# Patient Record
Sex: Female | Born: 1945 | Race: White | Hispanic: No | Smoking: Never smoker
Health system: Southern US, Community
[De-identification: ages and names within clinical notes are randomized; demographics above are authoritative.]

## PROBLEM LIST (undated history)

## (undated) DIAGNOSIS — E785 Hyperlipidemia, unspecified: Secondary | ICD-10-CM

## (undated) DIAGNOSIS — M1990 Unspecified osteoarthritis, unspecified site: Secondary | ICD-10-CM

## (undated) DIAGNOSIS — T753XXA Motion sickness, initial encounter: Secondary | ICD-10-CM

## (undated) DIAGNOSIS — I10 Essential (primary) hypertension: Secondary | ICD-10-CM

## (undated) DIAGNOSIS — E559 Vitamin D deficiency, unspecified: Secondary | ICD-10-CM

## (undated) DIAGNOSIS — C44300 Unspecified malignant neoplasm of skin of unspecified part of face: Secondary | ICD-10-CM

## (undated) DIAGNOSIS — E7800 Pure hypercholesterolemia, unspecified: Secondary | ICD-10-CM

## (undated) DIAGNOSIS — R51 Headache: Secondary | ICD-10-CM

## (undated) DIAGNOSIS — J302 Other seasonal allergic rhinitis: Secondary | ICD-10-CM

## (undated) DIAGNOSIS — E119 Type 2 diabetes mellitus without complications: Secondary | ICD-10-CM

## (undated) DIAGNOSIS — R519 Headache, unspecified: Secondary | ICD-10-CM

## (undated) DIAGNOSIS — M109 Gout, unspecified: Secondary | ICD-10-CM

## (undated) DIAGNOSIS — C44311 Basal cell carcinoma of skin of nose: Secondary | ICD-10-CM

## (undated) DIAGNOSIS — E039 Hypothyroidism, unspecified: Secondary | ICD-10-CM

## (undated) DIAGNOSIS — B019 Varicella without complication: Secondary | ICD-10-CM

## (undated) DIAGNOSIS — M810 Age-related osteoporosis without current pathological fracture: Secondary | ICD-10-CM

## (undated) DIAGNOSIS — K649 Unspecified hemorrhoids: Secondary | ICD-10-CM

## (undated) HISTORY — PX: MOHS SURGERY: SUR867

## (undated) HISTORY — PX: ANKLE GANGLION CYST EXCISION: SHX1148

## (undated) HISTORY — PX: THYROID SURGERY: SHX805

## (undated) HISTORY — PX: OOPHORECTOMY: SHX86

## (undated) HISTORY — DX: Unspecified malignant neoplasm of skin of unspecified part of face: C44.300

## (undated) HISTORY — PX: CHOLECYSTECTOMY: SHX55

## (undated) HISTORY — PX: ABDOMINAL HYSTERECTOMY: SHX81

---

## 2010-04-28 ENCOUNTER — Ambulatory Visit: Payer: Self-pay | Admitting: Unknown Physician Specialty

## 2011-05-03 ENCOUNTER — Ambulatory Visit: Payer: Self-pay | Admitting: Family Medicine

## 2012-11-03 ENCOUNTER — Ambulatory Visit: Payer: Self-pay | Admitting: Unknown Physician Specialty

## 2014-10-21 ENCOUNTER — Ambulatory Visit: Payer: Self-pay | Admitting: Ophthalmology

## 2014-10-21 LAB — POTASSIUM: Potassium: 4 mmol/L (ref 3.5–5.1)

## 2014-11-01 HISTORY — PX: EYE SURGERY: SHX253

## 2014-11-06 ENCOUNTER — Ambulatory Visit: Payer: Self-pay | Admitting: Ophthalmology

## 2015-03-02 NOTE — Op Note (Signed)
PATIENT NAME:  Chelsea Hurley, Chelsea Hurley [facility] C MR#:  [id number] DATE OF BIRTH:  September 11, 1946  DATE OF PROCEDURE:  10/07/2015  PREPROCEDURE DIAGNOSIS: Epiretinal membrane, left eye.   POSTPROCEDURE DIAGNOSIS: Epiretinal membrane, left eye.   PROCEDURE PERFORMED: A 25-gauge pars plana vitrectomy with ICG, ERM, and ILM peeling in the left eye, and endolaser.  COMPLICATIONS: None.   BLOOD LOSS: Minimal.   ANESTHESIA: General.   PROCEDURE NOTE: The patient was examined in the clinic for a visually significant epiretinal membrane in the left eye. Risks, benefits, alternatives, and complications were discussed with patient. The patient elected to proceed with pars plana vitrectomy with membrane peeling. On the day of the procedure, the patient was greeted in the preoperative holding area. The left eye was marked, consent reviewed, any questions were answered. The patient was brought into the operating room in supine position and placed under general anesthesia. The left eye was prepped and draped in the usual sterile fashion. A 25-gauge trocar system was used and the infusion cannula was checked prior to starting the infusion to make sure it was in the vitreous cavity. A core vitrectomy was performed. The patient did have a partial PVD and so the posterior vitreous detachment was extended and then the periphery was shaved for 360 degrees. ICG was used to stain the retinal surface, and a flat macular contact lens was used in conjunction with ILM forceps to peel the epiretinal membrane and ILM for a radius of greater than 1 disk diameter around the fovea. Then, the wide-angle biome was then again used to examine the retina. There was some question of if part of the posterior hyaloid was still down and so Kenalog was used to stain the vitreous. There was no posterior hyaloid remnant; however, a scleral depressor was used to inspect the periphery for 360 degrees. There were no retinal breaks or tears found; however, there was one  area inferiorly of some lattice, which was lasered. The trocars were removed, and sutured if they were not watertight. Subconjunctival dexamethasone was placed. When the speculum was removed, the patient's eye was patched and shielded with Neo-Poly-Dex ointment, and the patient was brought out of general anesthesia and taken to the recovery area in stable condition.     ____________________________ Laban Emperor Oval Linsey, MD jdr:mw D: 11/06/2014 10:39:44 ET T: 11/06/2014 14:21:30 ET JOB#: [id number]  cc: Janett Billow D. Oval Linsey, MD, <Dictator> Mc Bloodworth D [facility] MD ELECTRONICALLY SIGNED 11/13/2014 10:12

## 2015-08-13 ENCOUNTER — Encounter: Payer: Self-pay | Admitting: *Deleted

## 2015-08-18 NOTE — Discharge Instructions (Signed)

## 2015-08-20 ENCOUNTER — Encounter
Admission: RE | Disposition: A | Discharge status: Home / Self Care | Payer: Self-pay | Source: Ambulatory Visit | Attending: Ophthalmology

## 2015-08-20 ENCOUNTER — Ambulatory Visit: Payer: Medicare Other | Admitting: Anesthesiology

## 2015-08-20 ENCOUNTER — Ambulatory Visit
Admission: RE | Admit: 2015-08-20 | Discharge: 2015-08-20 | Disposition: A | Discharge status: Home / Self Care | Payer: Medicare Other | Source: Ambulatory Visit | Attending: Ophthalmology | Admitting: Ophthalmology

## 2015-08-20 DIAGNOSIS — M810 Age-related osteoporosis without current pathological fracture: Secondary | ICD-10-CM | POA: Insufficient documentation

## 2015-08-20 DIAGNOSIS — H2512 Age-related nuclear cataract, left eye: Secondary | ICD-10-CM | POA: Diagnosis not present

## 2015-08-20 DIAGNOSIS — Z881 Allergy status to other antibiotic agents status: Secondary | ICD-10-CM | POA: Insufficient documentation

## 2015-08-20 DIAGNOSIS — Z90710 Acquired absence of both cervix and uterus: Secondary | ICD-10-CM | POA: Diagnosis not present

## 2015-08-20 DIAGNOSIS — Z9049 Acquired absence of other specified parts of digestive tract: Secondary | ICD-10-CM | POA: Diagnosis not present

## 2015-08-20 DIAGNOSIS — M1990 Unspecified osteoarthritis, unspecified site: Secondary | ICD-10-CM | POA: Diagnosis not present

## 2015-08-20 DIAGNOSIS — Z885 Allergy status to narcotic agent status: Secondary | ICD-10-CM | POA: Diagnosis not present

## 2015-08-20 DIAGNOSIS — Z888 Allergy status to other drugs, medicaments and biological substances status: Secondary | ICD-10-CM | POA: Diagnosis not present

## 2015-08-20 DIAGNOSIS — E7800 Pure hypercholesterolemia, unspecified: Secondary | ICD-10-CM | POA: Insufficient documentation

## 2015-08-20 DIAGNOSIS — I10 Essential (primary) hypertension: Secondary | ICD-10-CM | POA: Diagnosis not present

## 2015-08-20 DIAGNOSIS — E119 Type 2 diabetes mellitus without complications: Secondary | ICD-10-CM | POA: Insufficient documentation

## 2015-08-20 DIAGNOSIS — G43909 Migraine, unspecified, not intractable, without status migrainosus: Secondary | ICD-10-CM | POA: Insufficient documentation

## 2015-08-20 DIAGNOSIS — Z85828 Personal history of other malignant neoplasm of skin: Secondary | ICD-10-CM | POA: Diagnosis not present

## 2015-08-20 HISTORY — DX: Hypothyroidism, unspecified: E03.9

## 2015-08-20 HISTORY — DX: Headache, unspecified: R51.9

## 2015-08-20 HISTORY — DX: Type 2 diabetes mellitus without complications: E11.9

## 2015-08-20 HISTORY — DX: Age-related osteoporosis without current pathological fracture: M81.0

## 2015-08-20 HISTORY — DX: Other seasonal allergic rhinitis: J30.2

## 2015-08-20 HISTORY — DX: Essential (primary) hypertension: I10

## 2015-08-20 HISTORY — DX: Basal cell carcinoma of skin of nose: C44.311

## 2015-08-20 HISTORY — DX: Unspecified osteoarthritis, unspecified site: M19.90

## 2015-08-20 HISTORY — DX: Pure hypercholesterolemia, unspecified: E78.00

## 2015-08-20 HISTORY — DX: Motion sickness, initial encounter: T75.3XXA

## 2015-08-20 HISTORY — PX: CATARACT EXTRACTION W/PHACO: SHX586

## 2015-08-20 HISTORY — DX: Headache: R51

## 2015-08-20 LAB — GLUCOSE, CAPILLARY
GLUCOSE-CAPILLARY: 104 mg/dL — AB (ref 65–99)
Glucose-Capillary: 118 mg/dL — ABNORMAL HIGH (ref 65–99)

## 2015-08-20 SURGERY — PHACOEMULSIFICATION, CATARACT, WITH IOL INSERTION
Anesthesia: Monitor Anesthesia Care | Laterality: Left | Wound class: Clean

## 2015-08-20 MED ORDER — TIMOLOL MALEATE 0.5 % OP SOLN
OPHTHALMIC | Status: DC | PRN
  Administered 2015-08-20: 1 [drp] via OPHTHALMIC

## 2015-08-20 MED ORDER — FENTANYL CITRATE (PF) 100 MCG/2ML IJ SOLN
INTRAMUSCULAR | Status: DC | PRN
  Administered 2015-08-20: 50 ug via INTRAVENOUS

## 2015-08-20 MED ORDER — LACTATED RINGERS IV SOLN: INTRAVENOUS | Status: DC

## 2015-08-20 MED ORDER — NA HYALUR & NA CHOND-NA HYALUR 0.4-0.35 ML IO KIT
PACK | INTRAOCULAR | Status: DC | PRN
  Administered 2015-08-20: 1 mL via INTRAOCULAR

## 2015-08-20 MED ORDER — ARMC OPHTHALMIC DILATING GEL
1.0000 "application " | OPHTHALMIC | Status: DC | PRN
  Administered 2015-08-20 (×2): 1 via OPHTHALMIC

## 2015-08-20 MED ORDER — EPINEPHRINE HCL 1 MG/ML IJ SOLN
INTRAOCULAR | Status: DC | PRN
  Administered 2015-08-20: 85 mL via OPHTHALMIC

## 2015-08-20 MED ORDER — OXYCODONE HCL 5 MG/5ML PO SOLN: 5.0000 mg | Freq: Once | ORAL | Status: DC | PRN

## 2015-08-20 MED ORDER — OXYCODONE HCL 5 MG PO TABS: 5.0000 mg | ORAL_TABLET | Freq: Once | ORAL | Status: DC | PRN

## 2015-08-20 MED ORDER — LIDOCAINE HCL (PF) 4 % IJ SOLN
INTRAOCULAR | Status: DC | PRN
  Administered 2015-08-20: 1 mL via OPHTHALMIC

## 2015-08-20 MED ORDER — MIDAZOLAM HCL 2 MG/2ML IJ SOLN
INTRAMUSCULAR | Status: DC | PRN
  Administered 2015-08-20: 1 mg via INTRAVENOUS

## 2015-08-20 MED ORDER — TETRACAINE HCL 0.5 % OP SOLN
1.0000 [drp] | OPHTHALMIC | Status: DC | PRN
  Administered 2015-08-20: 1 [drp] via OPHTHALMIC

## 2015-08-20 MED ORDER — CEFUROXIME OPHTHALMIC INJECTION 1 MG/0.1 ML
INJECTION | OPHTHALMIC | Status: DC | PRN
  Administered 2015-08-20: 0.1 mL via INTRACAMERAL

## 2015-08-20 MED ORDER — POVIDONE-IODINE 5 % OP SOLN
1.0000 "application " | OPHTHALMIC | Status: DC | PRN
  Administered 2015-08-20: 1 via OPHTHALMIC

## 2015-08-20 MED ORDER — BRIMONIDINE TARTRATE 0.2 % OP SOLN
OPHTHALMIC | Status: DC | PRN
  Administered 2015-08-20: 1 [drp] via OPHTHALMIC

## 2015-08-20 SURGICAL SUPPLY — 25 items
CANNULA ANT/CHMB 27GA (MISCELLANEOUS) ×3 IMPLANT
GLOVE SURG LX 7.5 STRW (GLOVE) ×2
GLOVE SURG LX STRL 7.5 STRW (GLOVE) ×1 IMPLANT
GLOVE SURG TRIUMPH 8.0 PF LTX (GLOVE) ×3 IMPLANT
GOWN STRL REUS W/ TWL LRG LVL3 (GOWN DISPOSABLE) ×2 IMPLANT
GOWN STRL REUS W/TWL LRG LVL3 (GOWN DISPOSABLE) ×4
LENS IOL TECNIS 14.0 (Intraocular Lens) ×3 IMPLANT
MARKER SKIN SURG W/RULER VIO (MISCELLANEOUS) ×3 IMPLANT
NDL RETROBULBAR .5 NSTRL (NEEDLE) IMPLANT
NEEDLE FILTER BLUNT 18X 1/2SAF (NEEDLE) ×2
NEEDLE FILTER BLUNT 18X1 1/2 (NEEDLE) ×1 IMPLANT
PACK CATARACT BRASINGTON (MISCELLANEOUS) ×3 IMPLANT
PACK EYE AFTER SURG (MISCELLANEOUS) ×3 IMPLANT
PACK OPTHALMIC (MISCELLANEOUS) ×3 IMPLANT
RING MALYGIN 7.0 (MISCELLANEOUS) IMPLANT
SUT ETHILON 10-0 CS-B-6CS-B-6 (SUTURE)
SUT VICRYL  9 0 (SUTURE)
SUT VICRYL 9 0 (SUTURE) IMPLANT
SUTURE EHLN 10-0 CS-B-6CS-B-6 (SUTURE) IMPLANT
SYR 3ML LL SCALE MARK (SYRINGE) ×3 IMPLANT
SYR 5ML LL (SYRINGE) IMPLANT
SYR TB 1ML LUER SLIP (SYRINGE) ×3 IMPLANT
WATER STERILE IRR 250ML POUR (IV SOLUTION) ×3 IMPLANT
WATER STERILE IRR 500ML POUR (IV SOLUTION) IMPLANT
WIPE NON LINTING 3.25X3.25 (MISCELLANEOUS) ×3 IMPLANT

## 2015-08-20 NOTE — Anesthesia Preprocedure Evaluation (Signed)
Anesthesia Evaluation  Patient identified by MRN, date of birth, ID band Patient awake    Reviewed: Allergy & Precautions, H&P , Patient's Chart, lab work & pertinent test results  History of Anesthesia Complications Negative for: history of anesthetic complications  Airway Mallampati: II  TM Distance: >3 FB     Dental   Pulmonary neg pulmonary ROS,    Pulmonary exam normal        Cardiovascular hypertension, On Medications Normal cardiovascular exam     Neuro/Psych    GI/Hepatic negative GI ROS, Neg liver ROS,   Endo/Other  diabetes, Well Controlled, Type 2, Oral Hypoglycemic Agents  Renal/GU negative Renal ROS     Musculoskeletal   Abdominal   Peds  Hematology negative hematology ROS (+)   Anesthesia Other Findings   Reproductive/Obstetrics                             Anesthesia Physical Anesthesia Plan  ASA: II  Anesthesia Plan: MAC   Post-op Pain Management:    Induction:   Airway Management Planned:   Additional Equipment:   Intra-op Plan:   Post-operative Plan:   Informed Consent: I have reviewed the patients History and Physical, chart, labs and discussed the procedure including the risks, benefits and alternatives for the proposed anesthesia with the patient or authorized representative who has indicated his/her understanding and acceptance.     Plan Discussed with:   Anesthesia Plan Comments:         Anesthesia Quick Evaluation

## 2015-08-20 NOTE — Anesthesia Postprocedure Evaluation (Signed)
  Anesthesia Post-op Note  Patient: Chelsea Hurley  Procedure(s) Performed: Procedure(s) with comments: CATARACT EXTRACTION PHACO AND INTRAOCULAR LENS PLACEMENT (IOC) (Left) - DIABETIC - oral meds  Anesthesia type:MAC  Patient location: PACU  Post pain: Pain level controlled  Post assessment: Post-op Vital signs reviewed, Patient's Cardiovascular Status Stable, Respiratory Function Stable, Patent Airway and No signs of Nausea or vomiting  Post vital signs: Reviewed and stable  Last Vitals:  Filed Vitals:   08/20/15 0815  BP: 105/57  Pulse: 67  Temp:   Resp: 12    Level of consciousness: awake, alert  and patient cooperative  Complications: No apparent anesthesia complications

## 2015-08-20 NOTE — Transfer of Care (Signed)
Immediate Anesthesia Transfer of Care Note  Patient: Chelsea Hurley  Procedure(s) Performed: Procedure(s) with comments: CATARACT EXTRACTION PHACO AND INTRAOCULAR LENS PLACEMENT (IOC) (Left) - DIABETIC - oral meds  Patient Location: PACU  Anesthesia Type: MAC  Level of Consciousness: awake, alert  and patient cooperative  Airway and Oxygen Therapy: Patient Spontanous Breathing and Patient connected to supplemental oxygen  Post-op Assessment: Post-op Vital signs reviewed, Patient's Cardiovascular Status Stable, Respiratory Function Stable, Patent Airway and No signs of Nausea or vomiting  Post-op Vital Signs: Reviewed and stable  Complications: No apparent anesthesia complications

## 2015-08-20 NOTE — Op Note (Signed)
OPERATIVE NOTE  Chelsea Hurley [id number] 08/20/2015   PREOPERATIVE DIAGNOSIS:  Nuclear sclerotic cataract left eye. H25.12   POSTOPERATIVE DIAGNOSIS:    Nuclear sclerotic cataract left eye.     PROCEDURE:  Phacoemusification with posterior chamber intraocular lens placement of the left eye   LENS:   Implant Name Type Inv. Item Serial No. Manufacturer Lot No. LRB No. Used  LENS IOL TECNIS 14.0 - [id number] Intraocular Lens LENS IOL TECNIS 14.0 [id number] AMO   Left 1        ULTRASOUND TIME: 20  % of 1 minutes 51 seconds, CDE 22.4  SURGEON:  Wyonia Hough, MD   ANESTHESIA:  Topical with tetracaine drops and 2% Xylocaine jelly, augmented with 1% preservative-free intracameral lidocaine. .   COMPLICATIONS:  None.   DESCRIPTION OF PROCEDURE:  The patient was identified in the holding room and transported to the operating room and placed in the supine position under the operating microscope.  The left eye was identified as the operative eye and it was prepped and draped in the usual sterile ophthalmic fashion.   A 1 millimeter clear-corneal paracentesis was made at the 1:30 position.  0.5 ml of preservative-free 1% lidocaine was injected into the anterior chamber. The anterior chamber was filled with Viscoat viscoelastic.  A 2.4 millimeter keratome was used to make a near-clear corneal incision at the 10:30 position.  .  A curvilinear capsulorrhexis was made with a cystotome and capsulorrhexis forceps.  Balanced salt solution was used to hydrodissect and hydrodelineate the nucleus.   Phacoemulsification was then used in stop and chop fashion to remove the lens nucleus and epinucleus.  The remaining cortex was then removed using the irrigation and aspiration handpiece. Provisc was then placed into the capsular bag to distend it for lens placement.  A lens was then injected into the capsular bag.  The remaining viscoelastic was aspirated.   Wounds were hydrated with balanced salt  solution.  The anterior chamber was inflated to a physiologic pressure with balanced salt solution.  No wound leaks were noted. Cefuroxime 0.1 ml of a 10mg /ml solution was injected into the anterior chamber for a dose of 1 mg of intracameral antibiotic at the completion of the case.   Timolol and Brimonidine drops were applied to the eye.  The patient was taken to the recovery room in stable condition without complications of anesthesia or surgery.  [name] 08/20/2015, 8:06 AM

## 2015-08-20 NOTE — H&P (Signed)
\  The History and Physical notes are on paper, have been signed, and are to be scanned. The patient remains stable and unchanged from the H&P.   Previous H&P reviewed, patient examined, and there are no changes.  Chelsea Hurley 08/20/2015 7:34 AM

## 2015-08-20 NOTE — Anesthesia Procedure Notes (Signed)
Procedure Name: LMA Insertion Performed by: Nat Christen Pre-anesthesia Checklist: Patient identified, Emergency Drugs available, Suction available, Patient being monitored and Timeout performed Patient Re-evaluated:Patient Re-evaluated prior to inductionOxygen Delivery Method: Nasal cannula

## 2015-08-21 ENCOUNTER — Encounter: Payer: Self-pay | Admitting: Ophthalmology

## 2015-11-19 ENCOUNTER — Encounter: Payer: Self-pay | Admitting: *Deleted

## 2015-11-25 NOTE — Discharge Instructions (Signed)

## 2015-11-26 ENCOUNTER — Ambulatory Visit
Admission: RE | Admit: 2015-11-26 | Discharge: 2015-11-26 | Disposition: A | Discharge status: Home / Self Care | Payer: Medicare Other | Source: Ambulatory Visit | Attending: Ophthalmology | Admitting: Ophthalmology

## 2015-11-26 ENCOUNTER — Ambulatory Visit: Payer: Medicare Other | Admitting: Anesthesiology

## 2015-11-26 ENCOUNTER — Encounter
Admission: RE | Disposition: A | Discharge status: Home / Self Care | Payer: Self-pay | Source: Ambulatory Visit | Attending: Ophthalmology

## 2015-11-26 DIAGNOSIS — Z7984 Long term (current) use of oral hypoglycemic drugs: Secondary | ICD-10-CM | POA: Insufficient documentation

## 2015-11-26 DIAGNOSIS — H2511 Age-related nuclear cataract, right eye: Secondary | ICD-10-CM | POA: Diagnosis present

## 2015-11-26 DIAGNOSIS — I10 Essential (primary) hypertension: Secondary | ICD-10-CM | POA: Insufficient documentation

## 2015-11-26 DIAGNOSIS — E119 Type 2 diabetes mellitus without complications: Secondary | ICD-10-CM | POA: Insufficient documentation

## 2015-11-26 HISTORY — PX: CATARACT EXTRACTION W/PHACO: SHX586

## 2015-11-26 LAB — GLUCOSE, CAPILLARY
GLUCOSE-CAPILLARY: 123 mg/dL — AB (ref 65–99)
Glucose-Capillary: 135 mg/dL — ABNORMAL HIGH (ref 65–99)

## 2015-11-26 SURGERY — PHACOEMULSIFICATION, CATARACT, WITH IOL INSERTION
Anesthesia: Monitor Anesthesia Care | Laterality: Right

## 2015-11-26 MED ORDER — DEXAMETHASONE SODIUM PHOSPHATE 4 MG/ML IJ SOLN: 8.0000 mg | Freq: Once | INTRAMUSCULAR | Status: DC | PRN

## 2015-11-26 MED ORDER — NA HYALUR & NA CHOND-NA HYALUR 0.4-0.35 ML IO KIT
PACK | INTRAOCULAR | Status: DC | PRN
  Administered 2015-11-26: 1 mL via INTRAOCULAR

## 2015-11-26 MED ORDER — FENTANYL CITRATE (PF) 100 MCG/2ML IJ SOLN: 25.0000 ug | INTRAMUSCULAR | Status: DC | PRN

## 2015-11-26 MED ORDER — ACETAMINOPHEN 325 MG PO TABS: 325.0000 mg | ORAL_TABLET | ORAL | Status: DC | PRN

## 2015-11-26 MED ORDER — ARMC OPHTHALMIC DILATING GEL
1.0000 "application " | OPHTHALMIC | Status: DC | PRN
  Administered 2015-11-26 (×2): 1 via OPHTHALMIC

## 2015-11-26 MED ORDER — BSS IO SOLN
INTRAOCULAR | Status: DC | PRN
  Administered 2015-11-26: 75 mL via OPHTHALMIC

## 2015-11-26 MED ORDER — LACTATED RINGERS IV SOLN: 500.0000 mL | INTRAVENOUS | Status: DC

## 2015-11-26 MED ORDER — POVIDONE-IODINE 5 % OP SOLN
1.0000 "application " | OPHTHALMIC | Status: DC | PRN
  Administered 2015-11-26: 1 via OPHTHALMIC

## 2015-11-26 MED ORDER — ACETAMINOPHEN 160 MG/5ML PO SOLN: 325.0000 mg | ORAL | Status: DC | PRN

## 2015-11-26 MED ORDER — TETRACAINE HCL 0.5 % OP SOLN
1.0000 [drp] | OPHTHALMIC | Status: DC | PRN
  Administered 2015-11-26: 1 [drp] via OPHTHALMIC

## 2015-11-26 MED ORDER — CEFUROXIME OPHTHALMIC INJECTION 1 MG/0.1 ML
INJECTION | OPHTHALMIC | Status: DC | PRN
  Administered 2015-11-26: 0.1 mL via INTRACAMERAL

## 2015-11-26 MED ORDER — BALANCED SALT IO SOLN
INTRAOCULAR | Status: DC | PRN
  Administered 2015-11-26: 1 mL via OPHTHALMIC

## 2015-11-26 MED ORDER — FENTANYL CITRATE (PF) 100 MCG/2ML IJ SOLN
INTRAMUSCULAR | Status: DC | PRN
  Administered 2015-11-26: 50 ug via INTRAVENOUS

## 2015-11-26 MED ORDER — BRIMONIDINE TARTRATE 0.2 % OP SOLN
OPHTHALMIC | Status: DC | PRN
  Administered 2015-11-26: 1 [drp] via OPHTHALMIC

## 2015-11-26 MED ORDER — TIMOLOL MALEATE 0.5 % OP SOLN
OPHTHALMIC | Status: DC | PRN
  Administered 2015-11-26: 1 [drp] via OPHTHALMIC

## 2015-11-26 MED ORDER — MIDAZOLAM HCL 2 MG/2ML IJ SOLN
INTRAMUSCULAR | Status: DC | PRN
  Administered 2015-11-26: 2 mg via INTRAVENOUS

## 2015-11-26 MED ORDER — LACTATED RINGERS IV SOLN: INTRAVENOUS | Status: DC

## 2015-11-26 SURGICAL SUPPLY — 27 items
CANNULA ANT/CHMB 27GA (MISCELLANEOUS) ×3 IMPLANT
CARTRIDGE ABBOTT (MISCELLANEOUS) ×3 IMPLANT
GLOVE SURG LX 7.5 STRW (GLOVE) ×2
GLOVE SURG LX STRL 7.5 STRW (GLOVE) ×1 IMPLANT
GLOVE SURG TRIUMPH 8.0 PF LTX (GLOVE) ×3 IMPLANT
GOWN STRL REUS W/ TWL LRG LVL3 (GOWN DISPOSABLE) ×2 IMPLANT
GOWN STRL REUS W/TWL LRG LVL3 (GOWN DISPOSABLE) ×4
LENS IOL TECNIS 15.0 (Intraocular Lens) ×3 IMPLANT
LENS IOL TECNIS MONO 1P 15.0 (Intraocular Lens) ×1 IMPLANT
MARKER SKIN SURG W/RULER VIO (MISCELLANEOUS) ×3 IMPLANT
NDL RETROBULBAR .5 NSTRL (NEEDLE) IMPLANT
NEEDLE FILTER BLUNT 18X 1/2SAF (NEEDLE) ×2
NEEDLE FILTER BLUNT 18X1 1/2 (NEEDLE) ×1 IMPLANT
PACK CATARACT BRASINGTON (MISCELLANEOUS) ×3 IMPLANT
PACK EYE AFTER SURG (MISCELLANEOUS) ×3 IMPLANT
PACK OPTHALMIC (MISCELLANEOUS) ×3 IMPLANT
RING MALYGIN 7.0 (MISCELLANEOUS) IMPLANT
SUT ETHILON 10-0 CS-B-6CS-B-6 (SUTURE)
SUT VICRYL  9 0 (SUTURE)
SUT VICRYL 9 0 (SUTURE) IMPLANT
SUTURE EHLN 10-0 CS-B-6CS-B-6 (SUTURE) IMPLANT
SYR 3ML LL SCALE MARK (SYRINGE) ×3 IMPLANT
SYR 5ML LL (SYRINGE) IMPLANT
SYR TB 1ML LUER SLIP (SYRINGE) ×3 IMPLANT
WATER STERILE IRR 250ML POUR (IV SOLUTION) ×3 IMPLANT
WATER STERILE IRR 500ML POUR (IV SOLUTION) IMPLANT
WIPE NON LINTING 3.25X3.25 (MISCELLANEOUS) ×3 IMPLANT

## 2015-11-26 NOTE — Anesthesia Preprocedure Evaluation (Signed)
Anesthesia Evaluation  Patient identified by MRN, date of birth, ID band Patient awake    Reviewed: Allergy & Precautions, H&P , Patient's Chart, lab work & pertinent test results  History of Anesthesia Complications Negative for: history of anesthetic complications  Airway Mallampati: II  TM Distance: >3 FB     Dental   Pulmonary neg pulmonary ROS,    Pulmonary exam normal        Cardiovascular hypertension, On Medications Normal cardiovascular exam     Neuro/Psych    GI/Hepatic negative GI ROS, Neg liver ROS,   Endo/Other  diabetes, Well Controlled, Type 2, Oral Hypoglycemic Agents  Renal/GU negative Renal ROS     Musculoskeletal   Abdominal   Peds  Hematology negative hematology ROS (+)   Anesthesia Other Findings   Reproductive/Obstetrics                             Anesthesia Physical Anesthesia Plan  ASA: II  Anesthesia Plan: MAC   Post-op Pain Management:    Induction:   Airway Management Planned:   Additional Equipment:   Intra-op Plan:   Post-operative Plan:   Informed Consent: I have reviewed the patients History and Physical, chart, labs and discussed the procedure including the risks, benefits and alternatives for the proposed anesthesia with the patient or authorized representative who has indicated his/her understanding and acceptance.     Plan Discussed with:   Anesthesia Plan Comments:         Anesthesia Quick Evaluation  

## 2015-11-26 NOTE — Anesthesia Procedure Notes (Signed)
Procedure Name: MAC Performed by: Digby Groeneveld Pre-anesthesia Checklist: Patient identified, Emergency Drugs available, Suction available, Timeout performed and Patient being monitored Patient Re-evaluated:Patient Re-evaluated prior to inductionOxygen Delivery Method: Nasal cannula Placement Confirmation: positive ETCO2       

## 2015-11-26 NOTE — Transfer of Care (Signed)
Immediate Anesthesia Transfer of Care Note  Patient: Chelsea Hurley  Procedure(s) Performed: Procedure(s) with comments: CATARACT EXTRACTION PHACO AND INTRAOCULAR LENS PLACEMENT (IOC) (Right) - DIABETIC  Patient Location: PACU  Anesthesia Type: MAC  Level of Consciousness: awake, alert  and patient cooperative  Airway and Oxygen Therapy: Patient Spontanous Breathing and Patient connected to supplemental oxygen  Post-op Assessment: Post-op Vital signs reviewed, Patient's Cardiovascular Status Stable, Respiratory Function Stable, Patent Airway and No signs of Nausea or vomiting  Post-op Vital Signs: Reviewed and stable  Complications: No apparent anesthesia complications

## 2015-11-26 NOTE — Op Note (Signed)
LOCATION:  [place]   PREOPERATIVE DIAGNOSIS:    Nuclear sclerotic cataract right eye. H25.11   POSTOPERATIVE DIAGNOSIS:  Nuclear sclerotic cataract right eye.     PROCEDURE:  Phacoemusification with posterior chamber intraocular lens placement of the right eye   LENS:   Implant Name Type Inv. Item Serial No. Manufacturer Lot No. LRB No. Used  LENS IMPL INTRAOC ZCB00 15.0 - [id number] Intraocular Lens LENS IMPL INTRAOC ZCB00 15.0 [id number]   Right 1        ULTRASOUND TIME: 11 % of 0 minutes, 56 seconds.  CDE 6.1   SURGEON:  Wyonia Hough, MD   ANESTHESIA:  Topical with tetracaine drops and 2% Xylocaine jelly.   COMPLICATIONS:  None.   DESCRIPTION OF PROCEDURE:  The patient was identified in the holding room and transported to the operating room and placed in the supine position under the operating microscope.  The right eye was identified as the operative eye and it was prepped and draped in the usual sterile ophthalmic fashion.   A 1 millimeter clear-corneal paracentesis was made at the 12:00 position.  The anterior chamber was filled with Viscoat viscoelastic.  A 2.4 millimeter keratome was used to make a near-clear corneal incision at the 9:00 position.  A curvilinear capsulorrhexis was made with a cystotome and capsulorrhexis forceps.  Balanced salt solution was used to hydrodissect and hydrodelineate the nucleus.   Phacoemulsification was then used in stop and chop fashion to remove the lens nucleus and epinucleus.  The remaining cortex was then removed using the irrigation and aspiration handpiece. Provisc was then placed into the capsular bag to distend it for lens placement.  A lens was then injected into the capsular bag.  The remaining viscoelastic was aspirated.   Wounds were hydrated with balanced salt solution.  The anterior chamber was inflated to a physiologic pressure with balanced salt solution.  No wound leaks were noted. Cefuroxime 0.1 ml of a  10mg /ml solution was injected into the anterior chamber for a dose of 1 mg of intracameral antibiotic at the completion of the case.   Timolol and Brimonidine drops were applied to the eye.  The patient was taken to the recovery room in stable condition without complications of anesthesia or surgery.   Samyukta Cura 11/26/2015, 8:02 AM

## 2015-11-26 NOTE — H&P (Signed)
  The History and Physical notes are on paper, have been signed, and are to be scanned. The patient remains stable and unchanged from the H&P.   Previous H&P reviewed, patient examined, and there are no changes.  Chelsea Hurley 11/26/2015 7:34 AM

## 2015-11-26 NOTE — Anesthesia Postprocedure Evaluation (Signed)
Anesthesia Post Note  Patient: Chelsea Hurley  Procedure(s) Performed: Procedure(s) (LRB): CATARACT EXTRACTION PHACO AND INTRAOCULAR LENS PLACEMENT (IOC) (Right)  Patient location during evaluation: PACU Anesthesia Type: MAC Level of consciousness: awake and alert Pain management: pain level controlled Vital Signs Assessment: post-procedure vital signs reviewed and stable Respiratory status: spontaneous breathing, nonlabored ventilation and respiratory function stable Cardiovascular status: blood pressure returned to baseline and stable Postop Assessment: no signs of nausea or vomiting Anesthetic complications: no    DANIEL D KOVACS

## 2015-11-27 ENCOUNTER — Encounter: Payer: Self-pay | Admitting: Ophthalmology

## 2016-02-05 ENCOUNTER — Ambulatory Visit: Payer: Medicare Other | Admitting: Anesthesiology

## 2016-02-05 ENCOUNTER — Encounter
Admission: RE | Disposition: A | Discharge status: Home / Self Care | Payer: Self-pay | Source: Ambulatory Visit | Attending: Unknown Physician Specialty

## 2016-02-05 ENCOUNTER — Encounter: Payer: Self-pay | Admitting: Anesthesiology

## 2016-02-05 ENCOUNTER — Ambulatory Visit
Admission: RE | Admit: 2016-02-05 | Discharge: 2016-02-05 | Disposition: A | Discharge status: Home / Self Care | Payer: Medicare Other | Source: Ambulatory Visit | Attending: Unknown Physician Specialty | Admitting: Unknown Physician Specialty

## 2016-02-05 DIAGNOSIS — Z90710 Acquired absence of both cervix and uterus: Secondary | ICD-10-CM | POA: Insufficient documentation

## 2016-02-05 DIAGNOSIS — E039 Hypothyroidism, unspecified: Secondary | ICD-10-CM | POA: Insufficient documentation

## 2016-02-05 DIAGNOSIS — K640 First degree hemorrhoids: Secondary | ICD-10-CM | POA: Insufficient documentation

## 2016-02-05 DIAGNOSIS — Z7984 Long term (current) use of oral hypoglycemic drugs: Secondary | ICD-10-CM | POA: Insufficient documentation

## 2016-02-05 DIAGNOSIS — M1990 Unspecified osteoarthritis, unspecified site: Secondary | ICD-10-CM | POA: Insufficient documentation

## 2016-02-05 DIAGNOSIS — E119 Type 2 diabetes mellitus without complications: Secondary | ICD-10-CM | POA: Diagnosis not present

## 2016-02-05 DIAGNOSIS — Z8249 Family history of ischemic heart disease and other diseases of the circulatory system: Secondary | ICD-10-CM | POA: Insufficient documentation

## 2016-02-05 DIAGNOSIS — D123 Benign neoplasm of transverse colon: Secondary | ICD-10-CM | POA: Insufficient documentation

## 2016-02-05 DIAGNOSIS — Z8049 Family history of malignant neoplasm of other genital organs: Secondary | ICD-10-CM | POA: Insufficient documentation

## 2016-02-05 DIAGNOSIS — Z79899 Other long term (current) drug therapy: Secondary | ICD-10-CM | POA: Insufficient documentation

## 2016-02-05 DIAGNOSIS — Z885 Allergy status to narcotic agent status: Secondary | ICD-10-CM | POA: Diagnosis not present

## 2016-02-05 DIAGNOSIS — Z881 Allergy status to other antibiotic agents status: Secondary | ICD-10-CM | POA: Diagnosis not present

## 2016-02-05 DIAGNOSIS — Z7982 Long term (current) use of aspirin: Secondary | ICD-10-CM | POA: Diagnosis not present

## 2016-02-05 DIAGNOSIS — Z86010 Personal history of colonic polyps: Secondary | ICD-10-CM | POA: Insufficient documentation

## 2016-02-05 DIAGNOSIS — Z1211 Encounter for screening for malignant neoplasm of colon: Secondary | ICD-10-CM | POA: Insufficient documentation

## 2016-02-05 DIAGNOSIS — Z85828 Personal history of other malignant neoplasm of skin: Secondary | ICD-10-CM | POA: Diagnosis not present

## 2016-02-05 DIAGNOSIS — I10 Essential (primary) hypertension: Secondary | ICD-10-CM | POA: Insufficient documentation

## 2016-02-05 DIAGNOSIS — E7800 Pure hypercholesterolemia, unspecified: Secondary | ICD-10-CM | POA: Diagnosis not present

## 2016-02-05 DIAGNOSIS — Z888 Allergy status to other drugs, medicaments and biological substances status: Secondary | ICD-10-CM | POA: Diagnosis not present

## 2016-02-05 DIAGNOSIS — E785 Hyperlipidemia, unspecified: Secondary | ICD-10-CM | POA: Insufficient documentation

## 2016-02-05 DIAGNOSIS — D120 Benign neoplasm of cecum: Secondary | ICD-10-CM | POA: Diagnosis not present

## 2016-02-05 HISTORY — PX: COLONOSCOPY WITH PROPOFOL: SHX5780

## 2016-02-05 LAB — GLUCOSE, CAPILLARY: GLUCOSE-CAPILLARY: 131 mg/dL — AB (ref 65–99)

## 2016-02-05 SURGERY — COLONOSCOPY WITH PROPOFOL
Anesthesia: General

## 2016-02-05 MED ORDER — SODIUM CHLORIDE 0.9 % IV SOLN: INTRAVENOUS | Status: DC

## 2016-02-05 MED ORDER — PROPOFOL 10 MG/ML IV BOLUS
INTRAVENOUS | Status: DC | PRN
  Administered 2016-02-05: 10 mg via INTRAVENOUS
  Administered 2016-02-05: 30 mg via INTRAVENOUS
  Administered 2016-02-05: 10 mg via INTRAVENOUS

## 2016-02-05 MED ORDER — PROPOFOL 500 MG/50ML IV EMUL
INTRAVENOUS | Status: DC | PRN
  Administered 2016-02-05: 100 ug/kg/min via INTRAVENOUS

## 2016-02-05 MED ORDER — LIDOCAINE HCL (PF) 2 % IJ SOLN
INTRAMUSCULAR | Status: DC | PRN
  Administered 2016-02-05: 50 mg

## 2016-02-05 MED ORDER — PHENYLEPHRINE HCL 10 MG/ML IJ SOLN
INTRAMUSCULAR | Status: DC | PRN
  Administered 2016-02-05 (×5): 100 ug via INTRAVENOUS

## 2016-02-05 MED ORDER — FENTANYL CITRATE (PF) 100 MCG/2ML IJ SOLN
INTRAMUSCULAR | Status: DC | PRN
  Administered 2016-02-05: 50 ug via INTRAVENOUS

## 2016-02-05 MED ORDER — SODIUM CHLORIDE 0.9 % IV SOLN
INTRAVENOUS | Status: DC
  Administered 2016-02-05: 1000 mL via INTRAVENOUS

## 2016-02-05 MED ORDER — MIDAZOLAM HCL 5 MG/5ML IJ SOLN
INTRAMUSCULAR | Status: DC | PRN
  Administered 2016-02-05: 1 mg via INTRAVENOUS

## 2016-02-05 NOTE — Transfer of Care (Signed)
Immediate Anesthesia Transfer of Care Note  Patient: Chelsea Hurley  Procedure(s) Performed: Procedure(s): COLONOSCOPY WITH PROPOFOL (N/A)  Patient Location: PACU  Anesthesia Type:General  Level of Consciousness: sedated  Airway & Oxygen Therapy: Patient Spontanous Breathing  Post-op Assessment: Report given to RN and Post -op Vital signs reviewed and stable  Post vital signs: Reviewed and stable  Last Vitals:  Filed Vitals:   02/05/16 0726  BP: 142/64  Pulse: 90  Temp: 36 C  Resp: 16    Complications: No apparent anesthesia complications

## 2016-02-05 NOTE — Op Note (Signed)
[facility] Gastroenterology Patient Name: Chelsea Hurley Procedure Date: 02/05/2016 8:37 AM MRN: [id number] Account #: [id number] Date of Birth: [birth year] Admit Type: Outpatient Age: 70 Room: [facility] ENDO ROOM 1 Gender: Female Note Status: Finalized Procedure:            Colonoscopy Indications:          High risk colon cancer surveillance: Personal history                        of colonic polyps Providers:            Manya Silvas, MD Referring MD:         Irven Easterly. Kary Kos, MD (Referring MD) Medicines:            Propofol per Anesthesia Complications:        No immediate complications. Procedure:            Pre-Anesthesia Assessment:                       - After reviewing the risks and benefits, the patient                        was deemed in satisfactory condition to undergo the                        procedure.                       After obtaining informed consent, the colonoscope was                        passed under direct vision. Throughout the procedure,                        the patient's blood pressure, pulse, and oxygen                        saturations were monitored continuously. The                        Colonoscope was introduced through the anus and                        advanced to the the cecum, identified by appendiceal                        orifice and ileocecal valve. The colonoscopy was                        performed without difficulty. The patient tolerated the                        procedure well. The quality of the bowel preparation                        was excellent. Findings:      A diminutive polyp was found in the cecum. The polyp was sessile. The       polyp was removed with a jumbo cold forceps. Resection and retrieval       were complete.      A small polyp was  found in the hepatic flexure. The polyp was sessile.       The polyp was removed with a hot snare. Resection and retrieval were       complete.  Internal hemorrhoids were found during endoscopy. The hemorrhoids were       small and Grade I (internal hemorrhoids that do not prolapse).      The exam was otherwise without abnormality. Impression:           - One diminutive polyp in the cecum, removed with a                        jumbo cold forceps. Resected and retrieved.                       - One small polyp at the hepatic flexure, removed with                        a hot snare. Resected and retrieved.                       - Internal hemorrhoids.                       - The examination was otherwise normal. Recommendation:       - Await pathology results. Manya Silvas, MD 02/05/2016 9:07:45 AM This report has been signed electronically. Number of Addenda: 0 Note Initiated On: 02/05/2016 8:37 AM Scope Withdrawal Time: 0 hours 11 minutes 15 seconds  Total Procedure Duration: 0 hours 24 minutes 7 seconds       [facility]

## 2016-02-05 NOTE — H&P (Signed)
Primary Care Physician:  [place]land Pink, MD Primary Gastroenterologist:  Dr. Vira Agar  Pre-Procedure History & Physical: HPI:  Chelsea Hurley is a 70 y.o. female is here for an colonoscopy.   Past Medical History  Diagnosis Date  . Hypertension   . Diabetes mellitus without complication ([place])   . Hypercholesteremia   . Hypothyroidism   . Seasonal allergies   . Basal cell carcinoma of nose   . Osteoporosis   . Headache     migraines - every 3-4 months  . Arthritis     osteo - hands  . Motion sickness     roller coasters, back seat of car    Past Surgical History  Procedure Laterality Date  . Cholecystectomy    . Abdominal hysterectomy    . Thyroid surgery    . Ankle ganglion cyst excision    . Eye surgery Right 2016    retina  . Cataract extraction w/phaco Left 08/20/2015    Procedure: CATARACT EXTRACTION PHACO AND INTRAOCULAR LENS PLACEMENT (IOC);  Surgeon: Leandrew Koyanagi, MD;  Location: [place];  Service: Ophthalmology;  Laterality: Left;  DIABETIC - oral meds  . Cataract extraction w/phaco Right 11/26/2015    Procedure: CATARACT EXTRACTION PHACO AND INTRAOCULAR LENS PLACEMENT (IOC);  Surgeon: Leandrew Koyanagi, MD;  Location: [place];  Service: Ophthalmology;  Laterality: Right;  DIABETIC    Prior to Admission medications   Medication Sig Start Date End Date Taking? Authorizing Provider  acetaminophen (TYLENOL) 325 MG tablet Take 650 mg by mouth every 6 (six) hours as needed.   Yes Historical Provider, MD  aspirin-acetaminophen-caffeine (EXCEDRIN MIGRAINE) [phone] MG tablet Take by mouth every 6 (six) hours as needed for headache.   Yes Historical Provider, MD  Calcium-Vitamin D-Vitamin K (CALCIUM SOFT CHEWS PO) Take by mouth. AM   Yes Historical Provider, MD  cetirizine (ZYRTEC) 10 MG tablet Take 10 mg by mouth daily as needed for allergies.   Yes Historical Provider, MD  denosumab (PROLIA) 60 MG/ML SOLN injection Inject 60 mg into the  skin every 6 (six) months. Administer in upper arm, thigh, or abdomen   Yes Historical Provider, MD  diphenhydrAMINE (SOMINEX) 25 MG tablet Take 25 mg by mouth at bedtime as needed for sleep.   Yes Historical Provider, MD  latanoprost (XALATAN) 0.005 % ophthalmic solution Place 1 drop into both eyes at bedtime.   Yes Historical Provider, MD  levothyroxine (SYNTHROID, LEVOTHROID) 75 MCG tablet Take 75 mcg by mouth daily before breakfast.   Yes Historical Provider, MD  metFORMIN (GLUCOPHAGE) 500 MG tablet Take 500 mg by mouth daily. AM   Yes Historical Provider, MD  simvastatin (ZOCOR) 20 MG tablet Take 20 mg by mouth daily. AM   Yes Historical Provider, MD  triamterene-hydrochlorothiazide (DYAZIDE) 37.5-25 MG capsule Take 1 capsule by mouth daily. AM   Yes Historical Provider, MD  VITAMIN E PO Take by mouth. AM   Yes Historical Provider, MD    Allergies as of 01/29/2016 - Review Complete 11/26/2015  Allergen Reaction Noted  . Augmentin [amoxicillin-pot clavulanate] Nausea And Vomiting 08/13/2015  . Biaxin [clarithromycin] Other (See Comments) 08/13/2015  . Codeine Nausea And Vomiting 08/13/2015  . Morphine and related Nausea And Vomiting 08/13/2015    History reviewed. No pertinent family history.  Social History   Social History  . Marital Status: Divorced    Spouse Name: N/A  . Number of Children: N/A  . Years of Education: N/A   Occupational History  .  Not on file.   Social History Main Topics  . Smoking status: Never Smoker   . Smokeless tobacco: Not on file  . Alcohol Use: No  . Drug Use: Not on file  . Sexual Activity: Not on file   Other Topics Concern  . Not on file   Social History Narrative    Review of Systems: See HPI, otherwise negative ROS  Physical Exam: BP 142/64 mmHg  Pulse 90  Temp(Src) 96.8 F (36 C) (Tympanic)  Resp 16  Ht 4\' 11"  (1.499 m)  Wt 58.514 kg (129 lb)  BMI 26.04 kg/m2  SpO2 99% General:   Alert,  pleasant and cooperative in  NAD Head:  Normocephalic and atraumatic. Neck:  Supple; no masses or thyromegaly. Lungs:  Clear throughout to auscultation.    Heart:  Regular rate and rhythm. Abdomen:  Soft, nontender and nondistended. Normal bowel sounds, without guarding, and without rebound.   Neurologic:  Alert and  oriented x4;  grossly normal neurologically.  Impression/Plan: Chelsea Hurley is here for an colonoscopy to be performed for [facility] colon polyps  Risks, benefits, limitations, and alternatives regarding  colonoscopy have been reviewed with the patient.  Questions have been answered.  All parties agreeable.   Gaylyn Cheers, MD  02/05/2016, 8:35 AM

## 2016-02-05 NOTE — Anesthesia Preprocedure Evaluation (Signed)
Anesthesia Evaluation  Patient identified by MRN, date of birth, ID band Patient awake    Reviewed: Allergy & Precautions, H&P , NPO status , Patient's Chart, lab work & pertinent test results, reviewed documented beta blocker date and time   History of Anesthesia Complications Negative for: history of anesthetic complications  Airway Mallampati: I  TM Distance: >3 FB Neck ROM: full    Dental no notable dental hx. (+) Caps, Missing   Pulmonary neg pulmonary ROS,    Pulmonary exam normal breath sounds clear to auscultation       Cardiovascular Exercise Tolerance: Good hypertension, On Medications (-) angina(-) CAD, (-) Past MI, (-) Cardiac Stents and (-) CABG Normal cardiovascular exam(-) dysrhythmias (-) Valvular Problems/Murmurs Rhythm:regular Rate:Normal     Neuro/Psych negative neurological ROS  negative psych ROS   GI/Hepatic negative GI ROS, Neg liver ROS,   Endo/Other  diabetes, Oral Hypoglycemic AgentsHypothyroidism   Renal/GU negative Renal ROS  negative genitourinary   Musculoskeletal   Abdominal   Peds  Hematology negative hematology ROS (+)   Anesthesia Other Findings Past Medical History:   Hypertension                                                 Diabetes mellitus without complication (HCC)                 Hypercholesteremia                                           Hypothyroidism                                               Seasonal allergies                                           Basal cell carcinoma of nose                                 Osteoporosis                                                 Headache                                                       Comment:migraines - every 3-4 months   Arthritis                                                      Comment:osteo - hands  Motion sickness                                                Comment:roller coasters, back seat of  car   Reproductive/Obstetrics negative OB ROS                             Anesthesia Physical Anesthesia Plan  ASA: II  Anesthesia Plan: General   Post-op Pain Management:    Induction:   Airway Management Planned:   Additional Equipment:   Intra-op Plan:   Post-operative Plan:   Informed Consent: I have reviewed the patients History and Physical, chart, labs and discussed the procedure including the risks, benefits and alternatives for the proposed anesthesia with the patient or authorized representative who has indicated his/her understanding and acceptance.   Dental Advisory Given  Plan Discussed with: Anesthesiologist, CRNA and Surgeon  Anesthesia Plan Comments:         Anesthesia Quick Evaluation

## 2016-02-05 NOTE — Anesthesia Postprocedure Evaluation (Signed)
Anesthesia Post Note  Patient: Chelsea Hurley  Procedure(s) Performed: Procedure(s) (LRB): COLONOSCOPY WITH PROPOFOL (N/A)  Patient location during evaluation: Endoscopy Anesthesia Type: General Level of consciousness: awake and alert Pain management: pain level controlled Vital Signs Assessment: post-procedure vital signs reviewed and stable Respiratory status: spontaneous breathing, nonlabored ventilation, respiratory function stable and patient connected to nasal cannula oxygen Cardiovascular status: blood pressure returned to baseline and stable Postop Assessment: no signs of nausea or vomiting Anesthetic complications: no    Last Vitals:  Filed Vitals:   02/05/16 0910 02/05/16 0920  BP: 111/47 93/58  Pulse: 66   Temp: 36 C   Resp: 16     Last Pain: There were no vitals filed for this visit.               Martha Clan

## 2016-02-06 LAB — SURGICAL PATHOLOGY

## 2016-02-10 ENCOUNTER — Encounter: Payer: Self-pay | Admitting: Unknown Physician Specialty

## 2016-03-16 ENCOUNTER — Other Ambulatory Visit: Payer: Self-pay | Admitting: Obstetrics & Gynecology

## 2016-03-16 DIAGNOSIS — Z1231 Encounter for screening mammogram for malignant neoplasm of breast: Secondary | ICD-10-CM

## 2016-04-30 ENCOUNTER — Ambulatory Visit
Admission: RE | Admit: 2016-04-30 | Discharge: 2016-04-30 | Disposition: A | Discharge status: Home / Self Care | Payer: Medicare Other | Source: Ambulatory Visit | Attending: Obstetrics & Gynecology | Admitting: Obstetrics & Gynecology

## 2016-04-30 ENCOUNTER — Other Ambulatory Visit: Payer: Self-pay | Admitting: Obstetrics & Gynecology

## 2016-04-30 DIAGNOSIS — Z1231 Encounter for screening mammogram for malignant neoplasm of breast: Secondary | ICD-10-CM | POA: Diagnosis not present

## 2017-05-02 ENCOUNTER — Encounter: Payer: Self-pay | Admitting: Obstetrics & Gynecology

## 2017-05-02 ENCOUNTER — Ambulatory Visit: Payer: Self-pay | Admitting: Obstetrics & Gynecology

## 2017-06-09 ENCOUNTER — Ambulatory Visit (INDEPENDENT_AMBULATORY_CARE_PROVIDER_SITE_OTHER): Payer: Medicare Other | Admitting: Obstetrics & Gynecology

## 2017-06-09 ENCOUNTER — Encounter: Payer: Self-pay | Admitting: Obstetrics & Gynecology

## 2017-06-09 VITALS — BP 138/58 | HR 73 | Ht 59.0 in | Wt 130.0 lb

## 2017-06-09 DIAGNOSIS — Z0000 Encounter for general adult medical examination without abnormal findings: Secondary | ICD-10-CM

## 2017-06-09 DIAGNOSIS — Z1211 Encounter for screening for malignant neoplasm of colon: Secondary | ICD-10-CM

## 2017-06-09 DIAGNOSIS — Z124 Encounter for screening for malignant neoplasm of cervix: Secondary | ICD-10-CM

## 2017-06-09 DIAGNOSIS — Z1239 Encounter for other screening for malignant neoplasm of breast: Secondary | ICD-10-CM

## 2017-06-09 DIAGNOSIS — Z87411 Personal history of vaginal dysplasia: Secondary | ICD-10-CM | POA: Insufficient documentation

## 2017-06-09 NOTE — Progress Notes (Signed)
HPI:      Chelsea Hurley is a 71 y.o. G2P0 who LMP was in the past, she presents today for her annual examination.  The patient has no complaints today. The patient is not sexually active. PRIOR h/o VAIN.  Herlast pap: approximate date 2017 and was normal and last mammogram: approximate date 2017 and was normal.  The patient does perform self breast exams.  There is no notable family history of breast or ovarian cancer in her family. The patient is not taking hormone replacement therapy. Patient denies post-menopausal vaginal bleeding.   The patient has regular exercise: yes. The patient denies current symptoms of depression.    GYN Hx: Last Colonoscopy:1 year ago. Normal.  Last DEXA: never ago.    PMHx: Past Medical History:  Diagnosis Date  . Arthritis    osteo - hands  . Basal cell carcinoma of nose   . Diabetes mellitus without complication ([place])   . Headache    migraines - every 3-4 months  . Hypercholesteremia   . Hypertension   . Hypothyroidism   . Motion sickness    roller coasters, back seat of car  . Osteoporosis   . Seasonal allergies    Past Surgical History:  Procedure Laterality Date  . ABDOMINAL HYSTERECTOMY    . ANKLE GANGLION CYST EXCISION    . CATARACT EXTRACTION W/PHACO Left 08/20/2015   Procedure: CATARACT EXTRACTION PHACO AND INTRAOCULAR LENS PLACEMENT (IOC);  Surgeon: Leandrew Koyanagi, MD;  Location: [facility];  Service: Ophthalmology;  Laterality: Left;  DIABETIC - oral meds  . CATARACT EXTRACTION W/PHACO Right 11/26/2015   Procedure: CATARACT EXTRACTION PHACO AND INTRAOCULAR LENS PLACEMENT (IOC);  Surgeon: Leandrew Koyanagi, MD;  Location: [facility];  Service: Ophthalmology;  Laterality: Right;  DIABETIC  . CHOLECYSTECTOMY    . COLONOSCOPY WITH PROPOFOL N/A 02/05/2016   Procedure: COLONOSCOPY WITH PROPOFOL;  Surgeon: Manya Silvas, MD;  Location: [facility] ENDOSCOPY;  Service: Endoscopy;  Laterality: N/A;  . EYE SURGERY Right 2016     retina  . THYROID SURGERY     Family History  Problem Relation Age of Onset  . Heart disease Mother   . Hypertension Father   . Uterine cancer Sister    Social History  Substance Use Topics  . Smoking status: Never Smoker  . Smokeless tobacco: Never Used  . Alcohol use No    Current Outpatient Prescriptions:  .  acetaminophen (TYLENOL) 325 MG tablet, Take 650 mg by mouth every 6 (six) hours as needed., Disp: , Rfl:  .  aspirin-acetaminophen-caffeine (EXCEDRIN MIGRAINE) 250-250-65 MG tablet, Take by mouth every 6 (six) hours as needed for headache., Disp: , Rfl:  .  Calcium-Vitamin D-Vitamin K (CALCIUM SOFT CHEWS PO), Take by mouth. AM, Disp: , Rfl:  .  cetirizine (ZYRTEC) 10 MG tablet, Take 10 mg by mouth daily as needed for allergies., Disp: , Rfl:  .  denosumab (PROLIA) 60 MG/ML SOLN injection, Inject 60 mg into the skin every 6 (six) months. Administer in upper arm, thigh, or abdomen, Disp: , Rfl:  .  latanoprost (XALATAN) 0.005 % ophthalmic solution, Place 1 drop into both eyes at bedtime., Disp: , Rfl:  .  levothyroxine (SYNTHROID, LEVOTHROID) 75 MCG tablet, Take 75 mcg by mouth daily before breakfast., Disp: , Rfl:  .  metFORMIN (GLUCOPHAGE) 500 MG tablet, Take 500 mg by mouth daily. AM, Disp: , Rfl:  .  simvastatin (ZOCOR) 20 MG tablet, Take 20 mg by mouth daily.  AM, Disp: , Rfl:  .  timolol (TIMOPTIC) 0.5 % ophthalmic solution, USE 1 DROP(S) IN BOTH EYES ONCE A DAY, Disp: , Rfl: 5 .  triamterene-hydrochlorothiazide (DYAZIDE) 37.5-25 MG capsule, Take 1 capsule by mouth daily. AM, Disp: , Rfl:  .  VITAMIN E PO, Take by mouth. AM, Disp: , Rfl:  Allergies: Augmentin [amoxicillin-pot clavulanate]; Biaxin [clarithromycin]; Codeine; and Morphine and related  Review of Systems  Constitutional: Negative for chills, fever and malaise/fatigue.  HENT: Negative for congestion, sinus pain and sore throat.   Eyes: Negative for blurred vision and pain.  Respiratory: Negative for cough  and wheezing.   Cardiovascular: Negative for chest pain and leg swelling.  Gastrointestinal: Negative for abdominal pain, constipation, diarrhea, heartburn, nausea and vomiting.  Genitourinary: Negative for dysuria, frequency, hematuria and urgency.  Musculoskeletal: Negative for back pain, joint pain, myalgias and neck pain.  Skin: Negative for itching and rash.  Neurological: Negative for dizziness, tremors and weakness.  Endo/Heme/Allergies: Does not bruise/bleed easily.  Psychiatric/Behavioral: Negative for depression. The patient is not nervous/anxious and does not have insomnia.     Objective: BP (!) 138/58 (BP Location: Left Arm, Patient Position: Sitting, Cuff Size: Normal)   Pulse 73   Ht 4\' 11"  (1.499 m)   Wt 130 lb (59 kg)   BMI 26.26 kg/m   Filed Weights   06/09/17 1414  Weight: 130 lb (59 kg)   Body mass index is 26.26 kg/m. Physical Exam  Constitutional: She is oriented to person, place, and time. She appears well-developed and well-nourished. No distress.  Genitourinary: Rectum normal and vagina normal. Pelvic exam was performed with patient supine. There is no rash or lesion on the right labia. There is no rash or lesion on the left labia. Vagina exhibits no lesion. No bleeding in the vagina. Right adnexum does not display mass and does not display tenderness. Left adnexum does not display mass and does not display tenderness.  Genitourinary Comments: Absent Uterus Absent cervix Vaginal cuff well healed Moderate Atrophy  HENT:  Head: Normocephalic and atraumatic. Head is without laceration.  Right Ear: Hearing normal.  Left Ear: Hearing normal.  Nose: No epistaxis.  No foreign bodies.  Mouth/Throat: Uvula is midline, oropharynx is clear and moist and mucous membranes are normal.  Eyes: Pupils are equal, round, and reactive to light.  Neck: Normal range of motion. Neck supple. No thyromegaly present.  Cardiovascular: Normal rate and regular rhythm.  Exam reveals  no gallop and no friction rub.   No murmur heard. Pulmonary/Chest: Effort normal and breath sounds normal. No respiratory distress. She has no wheezes. Right breast exhibits no mass, no skin change and no tenderness. Left breast exhibits no mass, no skin change and no tenderness.  Abdominal: Soft. Bowel sounds are normal. She exhibits no distension. There is no tenderness. There is no rebound.  Musculoskeletal: Normal range of motion.  Neurological: She is alert and oriented to person, place, and time. No cranial nerve deficit.  Skin: Skin is warm and dry.  Psychiatric: She has a normal mood and affect. Judgment normal.  Vitals reviewed.   Assessment: Annual Exam 1. Annual physical exam   2. Screening for breast cancer   3. History of vaginal dysplasia   4. Screen for colon cancer     Plan:            1.  Cervical Screening-  Pap smear done today  2. Breast screening- Exam annually and mammogram scheduled  3. Colonoscopy every 10  years, Hemoccult testing after age 68  4. Labs managed by PCP  5. Counseling for hormonal therapy: none, no change in therapy today     F/U  Return in about 1 year (around 06/09/2018) for Annual.  Barnett Applebaum, MD, [facility], [facility] 06/09/2017  2:42 PM  \

## 2017-06-09 NOTE — Patient Instructions (Signed)
PAP every year Mammogram every year    Call [phone] to schedule at [facility] Colonoscopy every 10 years Labs yearly (with PCP)   

## 2017-06-13 LAB — PAP IG (IMAGE GUIDED): PAP SMEAR COMMENT: 0

## 2017-06-24 ENCOUNTER — Ambulatory Visit
Admission: RE | Admit: 2017-06-24 | Discharge: 2017-06-24 | Disposition: A | Discharge status: Home / Self Care | Payer: Medicare Other | Source: Ambulatory Visit | Attending: Obstetrics & Gynecology | Admitting: Obstetrics & Gynecology

## 2017-06-24 DIAGNOSIS — Z1239 Encounter for other screening for malignant neoplasm of breast: Secondary | ICD-10-CM

## 2017-06-24 DIAGNOSIS — Z1231 Encounter for screening mammogram for malignant neoplasm of breast: Secondary | ICD-10-CM | POA: Insufficient documentation

## 2017-06-25 ENCOUNTER — Encounter: Payer: Self-pay | Admitting: Obstetrics & Gynecology

## 2018-06-14 ENCOUNTER — Ambulatory Visit: Payer: Medicare Other | Admitting: Obstetrics & Gynecology

## 2018-06-23 ENCOUNTER — Other Ambulatory Visit (HOSPITAL_COMMUNITY)
Admission: RE | Admit: 2018-06-23 | Discharge: 2018-06-23 | Disposition: A | Discharge status: Home / Self Care | Payer: Medicare Other | Source: Ambulatory Visit | Attending: Obstetrics & Gynecology | Admitting: Obstetrics & Gynecology

## 2018-06-23 ENCOUNTER — Encounter: Payer: Self-pay | Admitting: Obstetrics & Gynecology

## 2018-06-23 ENCOUNTER — Ambulatory Visit (INDEPENDENT_AMBULATORY_CARE_PROVIDER_SITE_OTHER): Payer: Medicare Other | Admitting: Obstetrics & Gynecology

## 2018-06-23 VITALS — BP 128/80 | Ht 59.0 in | Wt 130.0 lb

## 2018-06-23 DIAGNOSIS — Z90710 Acquired absence of both cervix and uterus: Secondary | ICD-10-CM | POA: Insufficient documentation

## 2018-06-23 DIAGNOSIS — Z124 Encounter for screening for malignant neoplasm of cervix: Secondary | ICD-10-CM

## 2018-06-23 DIAGNOSIS — Z87411 Personal history of vaginal dysplasia: Secondary | ICD-10-CM | POA: Insufficient documentation

## 2018-06-23 DIAGNOSIS — Z01419 Encounter for gynecological examination (general) (routine) without abnormal findings: Secondary | ICD-10-CM

## 2018-06-23 DIAGNOSIS — Z1272 Encounter for screening for malignant neoplasm of vagina: Secondary | ICD-10-CM | POA: Diagnosis present

## 2018-06-23 DIAGNOSIS — N893 Dysplasia of vagina, unspecified: Secondary | ICD-10-CM

## 2018-06-23 DIAGNOSIS — Z0000 Encounter for general adult medical examination without abnormal findings: Secondary | ICD-10-CM

## 2018-06-23 DIAGNOSIS — Z1211 Encounter for screening for malignant neoplasm of colon: Secondary | ICD-10-CM

## 2018-06-23 DIAGNOSIS — Z1239 Encounter for other screening for malignant neoplasm of breast: Secondary | ICD-10-CM

## 2018-06-23 NOTE — Patient Instructions (Signed)
PAP every year Mammogram every year    Call [phone] to schedule at [facility] Colonoscopy every 10 years Labs yearly (with PCP)   

## 2018-06-23 NOTE — Progress Notes (Signed)
HPI:      [name] is a 72 y.o. G2P0 who LMP was in the past, she presents today for her annual examination.  The patient has no complaints today. The patient is not currently sexually active. Herlast pap: approximate date 2018 and was normal and PRIOR h/o VAIN and last mammogram: approximate date 2018 and was normal.  The patient does perform self breast exams.  There is no notable family history of breast or ovarian cancer in her family. The patient is not taking hormone replacement therapy. Patient denies post-menopausal vaginal bleeding.   The patient has regular exercise: yes. The patient denies current symptoms of depression.    GYN Hx: Last Colonoscopy:2 years ago. Normal.  Last DEXA: never ago.    PMHx: Past Medical History:  Diagnosis Date  . Arthritis    osteo - hands  . Basal cell carcinoma of nose   . Diabetes mellitus without complication ([place])   . Headache    migraines - every 3-4 months  . Hypercholesteremia   . Hypertension   . Hypothyroidism   . Motion sickness    roller coasters, back seat of car  . Osteoporosis   . Seasonal allergies    Past Surgical History:  Procedure Laterality Date  . ABDOMINAL HYSTERECTOMY    . ANKLE GANGLION CYST EXCISION    . CATARACT EXTRACTION W/PHACO Left 08/20/2015   Procedure: CATARACT EXTRACTION PHACO AND INTRAOCULAR LENS PLACEMENT (IOC);  Surgeon: Leandrew Koyanagi, MD;  Location: [facility];  Service: Ophthalmology;  Laterality: Left;  DIABETIC - oral meds  . CATARACT EXTRACTION W/PHACO Right 11/26/2015   Procedure: CATARACT EXTRACTION PHACO AND INTRAOCULAR LENS PLACEMENT (IOC);  Surgeon: Leandrew Koyanagi, MD;  Location: [place] [facility];  Service: Ophthalmology;  Laterality: Right;  DIABETIC  . CHOLECYSTECTOMY    . COLONOSCOPY WITH PROPOFOL N/A 02/05/2016   Procedure: COLONOSCOPY WITH PROPOFOL;  Surgeon: Manya Silvas, MD;  Location: [facility] ENDOSCOPY;  Service: Endoscopy;  Laterality: N/A;  . EYE  SURGERY Right 2016   retina  . THYROID SURGERY     Family History  Problem Relation Age of Onset  . Heart disease Mother   . Hypertension Father   . Uterine cancer Sister    Social History   Tobacco Use  . Smoking status: Never Smoker  . Smokeless tobacco: Never Used  Substance Use Topics  . Alcohol use: No  . Drug use: No    Current Outpatient Medications:  .  acetaminophen (TYLENOL) 325 MG tablet, Take 650 mg by mouth every 6 (six) hours as needed., Disp: , Rfl:  .  aspirin-acetaminophen-caffeine (EXCEDRIN MIGRAINE) 250-250-65 MG tablet, Take by mouth every 6 (six) hours as needed for headache., Disp: , Rfl:  .  Calcium-Vitamin D-Vitamin K (CALCIUM SOFT CHEWS PO), Take by mouth. AM, Disp: , Rfl:  .  cetirizine (ZYRTEC) 10 MG tablet, Take 10 mg by mouth daily as needed for allergies., Disp: , Rfl:  .  denosumab (PROLIA) 60 MG/ML SOLN injection, Inject 60 mg into the skin every 6 (six) months. Administer in upper arm, thigh, or abdomen, Disp: , Rfl:  .  latanoprost (XALATAN) 0.005 % ophthalmic solution, Place 1 drop into both eyes at bedtime., Disp: , Rfl:  .  levothyroxine (SYNTHROID, LEVOTHROID) 75 MCG tablet, Take 75 mcg by mouth daily before breakfast., Disp: , Rfl:  .  metFORMIN (GLUCOPHAGE) 500 MG tablet, Take 500 mg by mouth daily. AM, Disp: , Rfl:  .  simvastatin (ZOCOR)  20 MG tablet, Take 20 mg by mouth daily. AM, Disp: , Rfl:  .  timolol (TIMOPTIC) 0.5 % ophthalmic solution, USE 1 DROP(S) IN BOTH EYES ONCE A DAY, Disp: , Rfl: 5 .  triamterene-hydrochlorothiazide (DYAZIDE) 37.5-25 MG capsule, Take 1 capsule by mouth daily. AM, Disp: , Rfl:  .  VITAMIN E PO, Take by mouth. AM, Disp: , Rfl:  Allergies: Augmentin [amoxicillin-pot clavulanate]; Biaxin [clarithromycin]; Codeine; and Morphine and related  Review of Systems  Constitutional: Negative for chills, fever and malaise/fatigue.  HENT: Negative for congestion, sinus pain and sore throat.   Eyes: Negative for blurred  vision and pain.  Respiratory: Negative for cough and wheezing.   Cardiovascular: Negative for chest pain and leg swelling.  Gastrointestinal: Negative for abdominal pain, constipation, diarrhea, heartburn, nausea and vomiting.  Genitourinary: Negative for dysuria, frequency, hematuria and urgency.  Musculoskeletal: Negative for back pain, joint pain, myalgias and neck pain.  Skin: Negative for itching and rash.  Neurological: Negative for dizziness, tremors and weakness.  Endo/Heme/Allergies: Does not bruise/bleed easily.  Psychiatric/Behavioral: Negative for depression. The patient is not nervous/anxious and does not have insomnia.    Objective: BP 128/80   Ht 4\' 11"  (1.499 m)   Wt 130 lb (59 kg)   BMI 26.26 kg/m   Filed Weights   06/23/18 0956  Weight: 130 lb (59 kg)   Body mass index is 26.26 kg/m. Physical Exam  Constitutional: She is oriented to person, place, and time. She appears well-developed and well-nourished. No distress.  Genitourinary: Rectum normal and vagina normal. Pelvic exam was performed with patient supine. There is no rash or lesion on the right labia. There is no rash or lesion on the left labia. Vagina exhibits no lesion. No bleeding in the vagina. Right adnexum does not display mass and does not display tenderness. Left adnexum does not display mass and does not display tenderness.  Genitourinary Comments: Absent Uterus Absent cervix Vaginal cuff well healed  HENT:  Head: Normocephalic and atraumatic. Head is without laceration.  Right Ear: Hearing normal.  Left Ear: Hearing normal.  Nose: No epistaxis.  No foreign bodies.  Mouth/Throat: Uvula is midline, oropharynx is clear and moist and mucous membranes are normal.  Eyes: Pupils are equal, round, and reactive to light.  Neck: Normal range of motion. Neck supple. No thyromegaly present.  Cardiovascular: Normal rate and regular rhythm. Exam reveals no gallop and no friction rub.  No murmur  heard. Pulmonary/Chest: Effort normal and breath sounds normal. No respiratory distress. She has no wheezes. Right breast exhibits no mass, no skin change and no tenderness. Left breast exhibits no mass, no skin change and no tenderness.  Abdominal: Soft. Bowel sounds are normal. She exhibits no distension. There is no tenderness. There is no rebound.  Musculoskeletal: Normal range of motion.  Neurological: She is alert and oriented to person, place, and time. No cranial nerve deficit.  Skin: Skin is warm and dry.  Psychiatric: She has a normal mood and affect. Judgment normal.  Vitals reviewed.  Assessment: Annual Exam 1. Annual physical exam   2. VAIN (vaginal intraepithelial neoplasia)   3. Screening for breast cancer    Plan:            1.  Cervical Screening-  Pap smear done today  2. Breast screening- Exam annually and mammogram scheduled  3. Colonoscopy every 5 years, Hemoccult testing after age 57  4. Labs managed by PCP  5. Counseling for hormonal therapy: no change  in therapy today  6. Declines flu    F/U  Return in about 1 year (around 06/24/2019) for Annual.  Barnett Applebaum, MD, [facility], [facility] 06/23/2018  10:24 AM

## 2018-06-26 LAB — CYTOLOGY - PAP: Diagnosis: NEGATIVE

## 2018-07-10 ENCOUNTER — Encounter: Payer: Self-pay | Admitting: Obstetrics & Gynecology

## 2018-07-10 ENCOUNTER — Ambulatory Visit
Admission: RE | Admit: 2018-07-10 | Discharge: 2018-07-10 | Disposition: A | Discharge status: Home / Self Care | Payer: Medicare Other | Source: Ambulatory Visit | Attending: Obstetrics & Gynecology | Admitting: Obstetrics & Gynecology

## 2018-07-10 DIAGNOSIS — Z1231 Encounter for screening mammogram for malignant neoplasm of breast: Secondary | ICD-10-CM | POA: Insufficient documentation

## 2018-07-10 DIAGNOSIS — Z1239 Encounter for other screening for malignant neoplasm of breast: Secondary | ICD-10-CM

## 2019-02-27 ENCOUNTER — Emergency Department
Admission: EM | Admit: 2019-02-27 | Discharge: 2019-02-27 | Disposition: A | Discharge status: Home / Self Care | Payer: Medicare Other | Attending: Emergency Medicine | Admitting: Emergency Medicine

## 2019-02-27 ENCOUNTER — Emergency Department: Payer: Medicare Other

## 2019-02-27 ENCOUNTER — Encounter: Payer: Self-pay | Admitting: Emergency Medicine

## 2019-02-27 DIAGNOSIS — Y92007 Garden or yard of unspecified non-institutional (private) residence as the place of occurrence of the external cause: Secondary | ICD-10-CM | POA: Diagnosis not present

## 2019-02-27 DIAGNOSIS — Y998 Other external cause status: Secondary | ICD-10-CM | POA: Diagnosis not present

## 2019-02-27 DIAGNOSIS — S62347A Nondisplaced fracture of base of fifth metacarpal bone. left hand, initial encounter for closed fracture: Secondary | ICD-10-CM

## 2019-02-27 DIAGNOSIS — S0990XA Unspecified injury of head, initial encounter: Secondary | ICD-10-CM | POA: Diagnosis present

## 2019-02-27 DIAGNOSIS — W19XXXA Unspecified fall, initial encounter: Secondary | ICD-10-CM

## 2019-02-27 DIAGNOSIS — W010XXA Fall on same level from slipping, tripping and stumbling without subsequent striking against object, initial encounter: Secondary | ICD-10-CM | POA: Insufficient documentation

## 2019-02-27 DIAGNOSIS — I10 Essential (primary) hypertension: Secondary | ICD-10-CM | POA: Diagnosis not present

## 2019-02-27 DIAGNOSIS — S9032XA Contusion of left foot, initial encounter: Secondary | ICD-10-CM | POA: Diagnosis not present

## 2019-02-27 DIAGNOSIS — Y9301 Activity, walking, marching and hiking: Secondary | ICD-10-CM | POA: Diagnosis not present

## 2019-02-27 DIAGNOSIS — Z79899 Other long term (current) drug therapy: Secondary | ICD-10-CM | POA: Insufficient documentation

## 2019-02-27 DIAGNOSIS — S0083XA Contusion of other part of head, initial encounter: Secondary | ICD-10-CM | POA: Diagnosis not present

## 2019-02-27 DIAGNOSIS — Z23 Encounter for immunization: Secondary | ICD-10-CM | POA: Diagnosis not present

## 2019-02-27 DIAGNOSIS — S80212A Abrasion, left knee, initial encounter: Secondary | ICD-10-CM | POA: Diagnosis not present

## 2019-02-27 MED ORDER — TETANUS-DIPHTH-ACELL PERTUSSIS 5-2.5-18.5 LF-MCG/0.5 IM SUSP
0.5000 mL | Freq: Once | INTRAMUSCULAR | Status: AC
  Administered 2019-02-27: 0.5 mL via INTRAMUSCULAR
  Filled 2019-02-27: qty 0.5

## 2019-02-27 MED ORDER — HYDROCODONE-ACETAMINOPHEN 5-325 MG PO TABS: 1.0000 | ORAL_TABLET | ORAL | 0 refills | Status: AC | PRN

## 2019-02-27 MED ORDER — HYDROCODONE-ACETAMINOPHEN 5-325 MG PO TABS: 1.0000 | ORAL_TABLET | ORAL | 0 refills | Status: DC | PRN

## 2019-02-27 NOTE — ED Triage Notes (Signed)
Patient reports tripping outside this afternoon and landing on her left wrist. Obvious swelling noted to left hand and wrist. Patient states she hit her head but denies LOC. Denies any other pain at this time. Patient not on blood thinners.

## 2019-02-27 NOTE — ED Notes (Signed)
See triage note  states she tripped on [address] [place] and fell on left side  Abrasion noted to lateral aspect of left knee  Bruising and swelling noted to left hand  And small area noted to left temporal area  No LOC

## 2019-02-27 NOTE — ED Provider Notes (Signed)
[facility] Emergency Department Provider Note  ____________________________________________  Time seen: Approximately 5:14 PM  I have reviewed the triage vital signs and the nursing notes.   HISTORY  Chief Complaint Fall    HPI Chelsea Hurley is a 73 y.o. female who presents the emergency department complaining of left-sided forehead pain, left wrist and hand pain, left knee pain, left foot pain after a mechanical fall.  Patient was outside, mowing her lawn when she tripped over her sidewalk.  Patient fell landing on her left knee and left hand.  She did end up hitting her head on the sidewalk as well but denies any loss of consciousness.  She reports that she has a "knot" to the left forehead.  She denies any blurred vision, significant headache or neck pain.  Her main complaint is left wrist and hand pain.  She did sustain an abrasion to the left knee and reports that she is due for a tetanus shot.  She endorses pain to the second through fifth digits of the left foot.  No other complaints at this time.  No medications prior to arrival.        Past Medical History:  Diagnosis Date  . Arthritis    osteo - hands  . Basal cell carcinoma of nose   . Diabetes mellitus without complication ([place])   . Headache    migraines - every 3-4 months  . Hypercholesteremia   . Hypertension   . Hypothyroidism   . Motion sickness    roller coasters, back seat of car  . Osteoporosis   . Seasonal allergies     Patient Active Problem List   Diagnosis Date Noted  . 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 History of vaginal dysplasia 06/09/2017    Past Surgical History:  Procedure Laterality Date  . ABDOMINAL HYSTERECTOMY    . ANKLE GANGLION CYST EXCISION    . CATARACT EXTRACTION W/PHACO Left 08/20/2015   Procedure: CATARACT EXTRACTION PHACO AND INTRAOCULAR LENS PLACEMENT (IOC);  Surgeon: Leandrew Koyanagi, MD;  Location: [place];   Service: Ophthalmology;  Laterality: Left;  DIABETIC - oral meds  . CATARACT EXTRACTION W/PHACO Right 11/26/2015   Procedure: CATARACT EXTRACTION PHACO AND INTRAOCULAR LENS PLACEMENT (IOC);  Surgeon: Leandrew Koyanagi, MD;  Location: [place];  Service: Ophthalmology;  Laterality: Right;  DIABETIC  . CHOLECYSTECTOMY    . COLONOSCOPY WITH PROPOFOL N/A 02/05/2016   Procedure: COLONOSCOPY WITH PROPOFOL;  Surgeon: Manya Silvas, MD;  Location: [facility] ENDOSCOPY;  Service: Endoscopy;  Laterality: N/A;  . EYE SURGERY Right 2016   retina  . OOPHORECTOMY    . THYROID SURGERY      Prior to Admission medications   Medication Sig Start Date End Date Taking? Authorizing Provider  acetaminophen (TYLENOL) 325 MG tablet Take 650 mg by mouth every 6 (six) hours as needed.    [provider]  aspirin-acetaminophen-caffeine (EXCEDRIN MIGRAINE) [phone] MG tablet Take by mouth every 6 (six) hours as needed for headache.    [provider]  Calcium-Vitamin D-Vitamin K (CALCIUM SOFT CHEWS PO) Take by mouth. AM    [provider]  cetirizine (ZYRTEC) 10 MG tablet Take 10 mg by mouth daily as needed for allergies.    [provider]  denosumab (PROLIA) 60 MG/ML SOLN injection Inject 60 mg into the skin every 6 (six) months. Administer in upper arm, thigh, or abdomen    [provider]  HYDROcodone-acetaminophen (NORCO/VICODIN) 5-325 MG tablet  Take 1 tablet by mouth every 4 (four) hours as needed for moderate pain. 02/27/19   Cuthriell, Charline Bills, PA-C  latanoprost (XALATAN) 0.005 % ophthalmic solution Place 1 drop into both eyes at bedtime.    [provider]  levothyroxine (SYNTHROID, LEVOTHROID) 75 MCG tablet Take 75 mcg by mouth daily before breakfast.    [provider]  metFORMIN (GLUCOPHAGE) 500 MG tablet Take 500 mg by mouth daily. AM    [provider]  simvastatin (ZOCOR) 20 MG tablet Take 20 mg by mouth daily. AM     [provider]  timolol (TIMOPTIC) 0.5 % ophthalmic solution USE 1 DROP(S) IN BOTH EYES ONCE A DAY 03/20/17   [provider]  triamterene-hydrochlorothiazide (DYAZIDE) 37.5-25 MG capsule Take 1 capsule by mouth daily. AM    [provider]  VITAMIN E PO Take by mouth. AM    [provider]    Allergies Augmentin [amoxicillin-pot clavulanate]; Biaxin [clarithromycin]; Codeine; Ibuprofen; and Morphine and related  Family History  Problem Relation Age of Onset  . Heart disease Mother   . Hypertension Father   . Uterine cancer Sister     Social History Social History   Tobacco Use  . Smoking status: Never Smoker  . Smokeless tobacco: Never Used  Substance Use Topics  . Alcohol use: No  . Drug use: No     Review of Systems  Constitutional: No fever/chills Eyes: No visual changes. No discharge ENT: No upper respiratory complaints. Cardiovascular: no chest pain. Respiratory: no cough. No SOB. Gastrointestinal: No abdominal pain.  No nausea, no vomiting.   Musculoskeletal: Positive for left facial pain, left wrist pain, left knee pain, left foot pain Skin: Negative for rash, abrasions, lacerations, ecchymosis. Neurological: Negative for headaches, focal weakness or numbness. 10-point ROS otherwise negative.  ____________________________________________   PHYSICAL EXAM:  VITAL SIGNS: ED Triage Vitals [02/27/19 1613]  Enc Vitals Group     BP (!) 141/62     Pulse Rate 76     Resp 16     Temp 98 F (36.7 C)     Temp Source Oral     SpO2 99 %     Weight 125 lb (56.7 kg)     Height 4\' 11"  (1.499 m)     Head Circumference      Peak Flow      Pain Score 7     Pain Loc      Pain Edu?      Excl. in [place]?      Constitutional: Alert and oriented. Well appearing and in no acute distress. Eyes: Conjunctivae are normal. PERRL. EOMI. Head: Hematoma noted to the left temporal region.  Small abrasion to the left nasal bridge and left  zygomatic region.  No active bleeding.  No foreign body.  Patient is very tender to palpation over the hematoma with no palpable abnormality or crepitus.  No other tenderness to palpation of the osseous structures of the skull and face.  No battle signs, raccoon eyes, serosanguineous fluid drainage from ears or nares ENT:      Ears:       Nose: No congestion/rhinnorhea.      Mouth/Throat: Mucous membranes are moist.  Neck: No stridor.  No cervical spine tenderness to palpation.  Cardiovascular: Normal rate, regular rhythm. Normal S1 and S2.  Good peripheral circulation. Respiratory: Normal respiratory effort without tachypnea or retractions. Lungs CTAB. Good air entry to the bases with no decreased or absent breath  sounds. Musculoskeletal: Full range of motion to all extremities. No gross deformities appreciated.  Visualization of the left wrist reveals mild edema over the distal ulna, along the fifth metacarpal region.  No other gross signs of trauma to the left wrist.  Full range of motion with extension, flexion and rotation of the wrist.  Patient is very tender to palpation midshaft over the fifth metacarpal with no palpable abnormality or deficit.  Full range of motion all 5 digits.  Sensation intact all 5 digits.  Capillary refill less than 2 seconds all digits.  Examination of the left knee reveals abrasion with no foreign body.  No active bleeding.  Full range of motion to the knee.  No significant osseous tenderness to palpation.  Varus, valgus, Lachman's, McMurray's is negative.  Examination of the left foot reveals mild ecchymosis of the second through fifth toe.  No open wounds.  Full range of motion all digits.  No tenderness to palpation over the tarsals or metatarsal region.  Dorsalis pedis pulse intact.  Sensation intact all digits.  Capillary refill less than 2 seconds all digits. Neurologic:  Normal speech and language. No gross focal neurologic deficits are appreciated.  Cranial nerves II  through XII grossly intact.  Negative Romberg's and pronator drift. Skin:  Skin is warm, dry and intact. No rash noted. Psychiatric: Mood and affect are normal. Speech and behavior are normal. Patient exhibits appropriate insight and judgement.   ____________________________________________   LABS (all labs ordered are listed, but only abnormal results are displayed)  Labs Reviewed - No data to display ____________________________________________  EKG   ____________________________________________  RADIOLOGY I personally viewed and evaluated these images as part of my medical decision making, as well as reviewing the written report by the radiologist.  I concur with radiologist finding of fracture at the base of the fifth metatarsal.  No other acute findings on CT or x-ray.  Dg Wrist Complete Left  Result Date: 02/27/2019 CLINICAL DATA:  Left hand and wrist pain secondary to a fall while mowing her yard. EXAM: LEFT WRIST - COMPLETE 3+ VIEW COMPARISON:  Hand radiographs dated 02/27/2019 FINDINGS: There is a faint lucent line through the base of the fifth metacarpal which could represent a nondisplaced hairline fracture. This is not definitive. There is severe arthritis of the first [facility] joint. There is slight narrowing of the radiocarpal joint. IMPRESSION: No definitive fracture. Possible hairline fracture at the base of the fifth meta carpal. Is the patient tender at that point? Electronically Signed   By: Lorriane Shire M.D.   On: 02/27/2019 17:23   Ct Head Wo Contrast  Result Date: 02/27/2019 CLINICAL DATA:  Fall.  Blunt trauma maxillofacial EXAM: CT HEAD WITHOUT CONTRAST CT MAXILLOFACIAL WITHOUT CONTRAST TECHNIQUE: Multidetector CT imaging of the head and maxillofacial structures were performed using the standard protocol without intravenous contrast. Multiplanar CT image reconstructions of the maxillofacial structures were also generated. COMPARISON:  None. FINDINGS: CT HEAD FINDINGS  Brain: Cerebral volume normal for age. Negative for hydrocephalus. Negative for acute infarct, hemorrhage, or mass. Vascular: Negative for hyperdense vessel Skull: Negative for fracture Other: None CT MAXILLOFACIAL FINDINGS Osseous: Negative for facial fracture. Mild degenerative change right TMJ and advanced degenerative change left TMJ. Mild degenerative changes cervical spine. Orbits: Negative for orbital mass or hematoma. Bilateral cataract surgery. Sinuses: Mild mucosal edema left maxillary sinus.  Otherwise clear. Soft tissues: Mild soft tissue swelling lateral to the left orbit. IMPRESSION: 1. No acute intracranial abnormality 2. Negative for facial  fracture. Electronically Signed   By: Franchot Gallo M.D.   On: 02/27/2019 16:58   Dg Knee Complete 4 Views Left  Result Date: 02/27/2019 CLINICAL DATA:  Fall while mowing the lawn today EXAM: LEFT KNEE - COMPLETE 4+ VIEW COMPARISON:  None FINDINGS: Osseous demineralization. Mild joint space narrowing greatest at medial compartment with tiny marginal spurs. No acute fracture, dislocation, or bone destruction. No knee joint effusion. IMPRESSION: Minimal degenerative changes at medial compartment LEFT knee. No acute abnormalities. Electronically Signed   By: Lavonia Dana M.D.   On: 02/27/2019 17:33   Dg Hand Complete Left  Result Date: 02/27/2019 CLINICAL DATA:  Left hand pain secondary to a fall today. EXAM: LEFT HAND - COMPLETE 3+ VIEW COMPARISON:  Wrist radiographs dated 02/27/2019 FINDINGS: A single view there is slight irregularity of the dorsal aspect of the base of the fifth metacarpal which could represent a hairline fracture. Severe arthritis of the first [facility] joint. Osteoarthritic changes of the IP joints of the digits, most severe in the IP joint of the thumb and in the index and long fingers. Slight narrowing of the radiocarpal joint. IMPRESSION: No definitive fracture. Possible hairline fracture of the base of the fifth metacarpal. Electronically  Signed   By: Lorriane Shire M.D.   On: 02/27/2019 17:25   Dg Foot Complete Left  Result Date: 02/27/2019 CLINICAL DATA:  Stubbed LEFT foot and fell down onto LEFT side while mowing the lawn today EXAM: LEFT FOOT - COMPLETE 3+ VIEW COMPARISON:  None FINDINGS: Osseous demineralization. Joint spaces preserved. No acute fracture, dislocation, or bone destruction. Plantar and Achilles insertion calcaneal spurs. IMPRESSION: Calcaneal spurring. No acute osseous abnormalities. Electronically Signed   By: Lavonia Dana M.D.   On: 02/27/2019 17:37   Ct Maxillofacial Wo Contrast  Result Date: 02/27/2019 CLINICAL DATA:  Fall.  Blunt trauma maxillofacial EXAM: CT HEAD WITHOUT CONTRAST CT MAXILLOFACIAL WITHOUT CONTRAST TECHNIQUE: Multidetector CT imaging of the head and maxillofacial structures were performed using the standard protocol without intravenous contrast. Multiplanar CT image reconstructions of the maxillofacial structures were also generated. COMPARISON:  None. FINDINGS: CT HEAD FINDINGS Brain: Cerebral volume normal for age. Negative for hydrocephalus. Negative for acute infarct, hemorrhage, or mass. Vascular: Negative for hyperdense vessel Skull: Negative for fracture Other: None CT MAXILLOFACIAL FINDINGS Osseous: Negative for facial fracture. Mild degenerative change right TMJ and advanced degenerative change left TMJ. Mild degenerative changes cervical spine. Orbits: Negative for orbital mass or hematoma. Bilateral cataract surgery. Sinuses: Mild mucosal edema left maxillary sinus.  Otherwise clear. Soft tissues: Mild soft tissue swelling lateral to the left orbit. IMPRESSION: 1. No acute intracranial abnormality 2. Negative for facial fracture. Electronically Signed   By: Franchot Gallo M.D.   On: 02/27/2019 16:58    ____________________________________________    PROCEDURES  Procedure(s) performed:    .Splint Application Date/Time: 03/18/6159 6:22 PM Performed by: Darletta Moll,  PA-C Authorized by: Darletta Moll, PA-C   Consent:    Consent obtained:  Verbal   Consent given by:  Patient   Risks discussed:  Pain, swelling and discoloration Procedure details:    Laterality:  Left   Location:  Hand   Hand:  L hand   Splint type:  Ulnar gutter   Supplies:  Cotton padding, Ortho-Glass and elastic bandage Post-procedure details:    Pain:  Improved   Sensation:  Normal   Patient tolerance of procedure:  Tolerated well, no immediate complications      Medications  Tdap (  BOOSTRIX) injection 0.5 mL (0.5 mLs Intramuscular Given 02/27/19 1715)     ____________________________________________   INITIAL IMPRESSION / ASSESSMENT AND PLAN / ED COURSE  Pertinent labs & imaging results that were available during my care of the patient were reviewed by me and considered in my medical decision making (see chart for details).  Review of the [place] CSRS was performed in accordance of the [place] prior to dispensing any controlled drugs.           Patient's diagnosis is consistent with fall resulting in contusion of the face, fracture of the fifth metacarpal, abrasion of the left knee contusion left foot.  Patient presented to the emergency department after mechanical fall.  Imaging reveals fifth metacarpal fracture with no other gross findings.  This was splinted in the emergency department.  Patient will be prescribed pain medication for same.  Follow-up with orthopedics..  Patient is given ED precautions to return to the ED for any worsening or new symptoms.     ____________________________________________  FINAL CLINICAL IMPRESSION(S) / ED DIAGNOSES  Final diagnoses:  Fall, initial encounter  Contusion of face, initial encounter  Closed nondisplaced fracture of base of fifth metacarpal bone of left hand, initial encounter  Abrasion of left knee, initial encounter  Contusion of left foot, initial encounter      NEW MEDICATIONS STARTED DURING THIS  VISIT:  ED Discharge Orders         Ordered    HYDROcodone-acetaminophen (NORCO/VICODIN) 5-325 MG tablet  Every 4 hours PRN,   Status:  Discontinued     02/27/19 1820    HYDROcodone-acetaminophen (NORCO/VICODIN) 5-325 MG tablet  Every 4 hours PRN     02/27/19 1820              This chart was dictated using voice recognition software/Dragon. Despite best efforts to proofread, errors can occur which can change the meaning. Any change was purely unintentional.    Darletta Moll, PA-C 02/27/19 Kennieth Francois, MD 02/27/19 2015

## 2019-07-02 ENCOUNTER — Encounter: Payer: Self-pay | Admitting: Obstetrics & Gynecology

## 2019-07-02 ENCOUNTER — Ambulatory Visit (INDEPENDENT_AMBULATORY_CARE_PROVIDER_SITE_OTHER): Payer: Medicare Other | Admitting: Obstetrics & Gynecology

## 2019-07-02 ENCOUNTER — Other Ambulatory Visit: Payer: Self-pay

## 2019-07-02 VITALS — BP 128/80 | Ht 59.0 in | Wt 126.0 lb

## 2019-07-02 DIAGNOSIS — Z01419 Encounter for gynecological examination (general) (routine) without abnormal findings: Secondary | ICD-10-CM | POA: Diagnosis not present

## 2019-07-02 DIAGNOSIS — Z1231 Encounter for screening mammogram for malignant neoplasm of breast: Secondary | ICD-10-CM

## 2019-07-02 NOTE — Patient Instructions (Signed)
PAP every three years Mammogram every year    Call [phone] to schedule at [facility] Colonoscopy every 5 years Labs yearly (with PCP)

## 2019-07-02 NOTE — Progress Notes (Signed)
HPI:      [name] is a 73 y.o. G2P0 who LMP was in the past, she presents today for her annual examination.  The patient has no complaints today. The patient is not currently sexually active. Herlast pap: approximate date 2019 and was normal and prior VAIN years ago and last mammogram: approximate date 2019 and was normal.  The patient does perform self breast exams.  There is no notable family history of breast or ovarian cancer in her family. The patient is not taking hormone replacement therapy. Patient denies post-menopausal vaginal bleeding.   The patient has regular exercise: yes. The patient denies current symptoms of depression.    GYN Hx: Last Colonoscopy:3 years ago. Normal.  Last DEXA: 2 years ago.    PMHx: Past Medical History:  Diagnosis Date  . Arthritis    osteo - hands  . Basal cell carcinoma of nose   . Diabetes mellitus without complication ([place])   . Headache    migraines - every 3-4 months  . Hypercholesteremia   . Hypertension   . Hypothyroidism   . Motion sickness    roller coasters, back seat of car  . Osteoporosis   . Seasonal allergies   . Skin cancer of face    Past Surgical History:  Procedure Laterality Date  . ABDOMINAL HYSTERECTOMY    . ANKLE GANGLION CYST EXCISION    . CATARACT EXTRACTION W/PHACO Left 08/20/2015   Procedure: CATARACT EXTRACTION PHACO AND INTRAOCULAR LENS PLACEMENT (IOC);  Surgeon: Leandrew Koyanagi, MD;  Location: [place];  Service: Ophthalmology;  Laterality: Left;  DIABETIC - oral meds  . CATARACT EXTRACTION W/PHACO Right 11/26/2015   Procedure: CATARACT EXTRACTION PHACO AND INTRAOCULAR LENS PLACEMENT (IOC);  Surgeon: Leandrew Koyanagi, MD;  Location: [place];  Service: Ophthalmology;  Laterality: Right;  DIABETIC  . CHOLECYSTECTOMY    . COLONOSCOPY WITH PROPOFOL N/A 02/05/2016   Procedure: COLONOSCOPY WITH PROPOFOL;  Surgeon: Manya Silvas, MD;  Location: [facility] ENDOSCOPY;  Service: Endoscopy;   Laterality: N/A;  . EYE SURGERY Right 2016   retina  . OOPHORECTOMY    . THYROID SURGERY     Family History  Problem Relation Age of Onset  . Heart disease Mother   . Hypertension Father   . Uterine cancer Sister    Social History   Tobacco Use  . Smoking status: Never Smoker  . Smokeless tobacco: Never Used  Substance Use Topics  . Alcohol use: No  . Drug use: No    Current Outpatient Medications:  .  acetaminophen (TYLENOL) 325 MG tablet, Take 650 mg by mouth every 6 (six) hours as needed., Disp: , Rfl:  .  aspirin-acetaminophen-caffeine (EXCEDRIN MIGRAINE) 250-250-65 MG tablet, Take by mouth every 6 (six) hours as needed for headache., Disp: , Rfl:  .  Calcium-Vitamin D-Vitamin K (CALCIUM SOFT CHEWS PO), Take by mouth. AM, Disp: , Rfl:  .  cetirizine (ZYRTEC) 10 MG tablet, Take 10 mg by mouth daily as needed for allergies., Disp: , Rfl:  .  denosumab (PROLIA) 60 MG/ML SOLN injection, Inject 60 mg into the skin every 6 (six) months. Administer in upper arm, thigh, or abdomen, Disp: , Rfl:  .  HYDROcodone-acetaminophen (NORCO/VICODIN) 5-325 MG tablet, Take 1 tablet by mouth every 4 (four) hours as needed for moderate pain., Disp: 20 tablet, Rfl: 0 .  latanoprost (XALATAN) 0.005 % ophthalmic solution, Place 1 drop into both eyes at bedtime., Disp: , Rfl:  .  levothyroxine (SYNTHROID, LEVOTHROID) 75 MCG tablet, Take 75 mcg by mouth daily before breakfast., Disp: , Rfl:  .  metFORMIN (GLUCOPHAGE) 500 MG tablet, Take 500 mg by mouth daily. AM, Disp: , Rfl:  .  simvastatin (ZOCOR) 20 MG tablet, Take 20 mg by mouth daily. AM, Disp: , Rfl:  .  timolol (TIMOPTIC) 0.5 % ophthalmic solution, USE 1 DROP(S) IN BOTH EYES ONCE A DAY, Disp: , Rfl: 5 .  triamterene-hydrochlorothiazide (DYAZIDE) 37.5-25 MG capsule, Take 1 capsule by mouth daily. AM, Disp: , Rfl:  .  VITAMIN E PO, Take by mouth. AM, Disp: , Rfl:  Allergies: Augmentin [amoxicillin-pot clavulanate], Biaxin [clarithromycin], Codeine,  Ibuprofen, and Morphine and related  Review of Systems  Constitutional: Negative for chills, fever and malaise/fatigue.  HENT: Negative for congestion, sinus pain and sore throat.   Eyes: Negative for blurred vision and pain.  Respiratory: Negative for cough and wheezing.   Cardiovascular: Negative for chest pain and leg swelling.  Gastrointestinal: Negative for abdominal pain, constipation, diarrhea, heartburn, nausea and vomiting.  Genitourinary: Negative for dysuria, frequency, hematuria and urgency.  Musculoskeletal: Negative for back pain, joint pain, myalgias and neck pain.  Skin: Negative for itching and rash.  Neurological: Negative for dizziness, tremors and weakness.  Endo/Heme/Allergies: Does not bruise/bleed easily.  Psychiatric/Behavioral: Negative for depression. The patient is not nervous/anxious and does not have insomnia.     Objective: BP 128/80   Ht 4\' 11"  (1.499 m)   Wt 126 lb (57.2 kg)   BMI 25.45 kg/m   Filed Weights   07/02/19 0949  Weight: 126 lb (57.2 kg)   Body mass index is 25.45 kg/m. Physical Exam Constitutional:      General: She is not in acute distress.    Appearance: She is well-developed.  Genitourinary:     Pelvic exam was performed with patient supine.     Vagina and rectum normal.     No lesions in the vagina.     No vaginal bleeding.     No right or left adnexal mass present.     Right adnexa not tender.     Left adnexa not tender.     Genitourinary Comments: Absent Uterus Absent cervix Vaginal cuff well healed Atrophy present  HENT:     Head: Normocephalic and atraumatic. No laceration.     Right Ear: Hearing normal.     Left Ear: Hearing normal.     Mouth/Throat:     Pharynx: Uvula midline.  Eyes:     Pupils: Pupils are equal, round, and reactive to light.  Neck:     Musculoskeletal: Normal range of motion and neck supple.     Thyroid: No thyromegaly.  Cardiovascular:     Rate and Rhythm: Normal rate and regular rhythm.      Heart sounds: No murmur. No friction rub. No gallop.   Pulmonary:     Effort: Pulmonary effort is normal. No respiratory distress.     Breath sounds: Normal breath sounds. No wheezing.  Chest:     Breasts:        Right: No mass, skin change or tenderness.        Left: No mass, skin change or tenderness.  Abdominal:     General: Bowel sounds are normal. There is no distension.     Palpations: Abdomen is soft.     Tenderness: There is no abdominal tenderness. There is no rebound.  Musculoskeletal: Normal range of motion.  Neurological:  Mental Status: She is alert and oriented to person, place, and time.     Cranial Nerves: No cranial nerve deficit.  Skin:    General: Skin is warm and dry.  Psychiatric:        Judgment: Judgment normal.  Vitals signs reviewed.     Assessment: Annual Exam 1. Women's annual routine gynecological examination   2. Encounter for mammogram to establish baseline mammogram     Plan:            1.  Cervical Screening-  Pap smear schedule reviewed with patient  2. Breast screening- Exam annually and mammogram scheduled  3. Colonoscopy every 5 years, Hemoccult testing after age 48  4. Labs managed by PCP  5. Counseling for hormonal therapy: none              6. FRAX - FRAX score for assessing the 10 year probability for fracture calculated and discussed today.  Based on age and score today, DEXA is not currently scheduled (UTD); sees [facility] rheumatology for Prolia.    F/U  Return in about 1 year (around 07/01/2020) for Annual.  Barnett Applebaum, MD, [facility], [facility] 07/02/2019  10:11 AM

## 2019-08-02 ENCOUNTER — Encounter: Payer: Self-pay | Admitting: Obstetrics & Gynecology

## 2019-08-02 ENCOUNTER — Ambulatory Visit
Admission: RE | Admit: 2019-08-02 | Discharge: 2019-08-02 | Disposition: A | Discharge status: Home / Self Care | Payer: Medicare Other | Source: Ambulatory Visit | Attending: Obstetrics & Gynecology | Admitting: Obstetrics & Gynecology

## 2019-08-02 DIAGNOSIS — Z1231 Encounter for screening mammogram for malignant neoplasm of breast: Secondary | ICD-10-CM | POA: Diagnosis not present

## 2020-06-27 IMAGING — MG MM DIGITAL SCREENING BILAT W/ TOMO W/ CAD
8 series · 8 of 24 positions shown · non-contrast
Comparison: Previous exam(s).

CLINICAL DATA: Screening.

EXAM:
DIGITAL SCREENING BILATERAL MAMMOGRAM WITH TOMO AND CAD

[L MLO synth-2D]
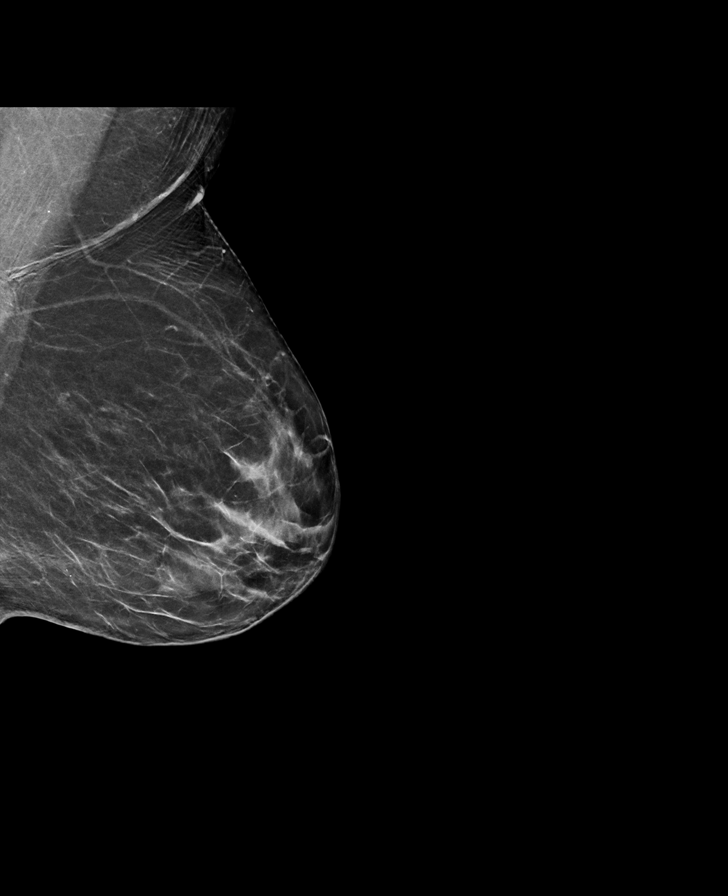

[R MLO synth-2D]
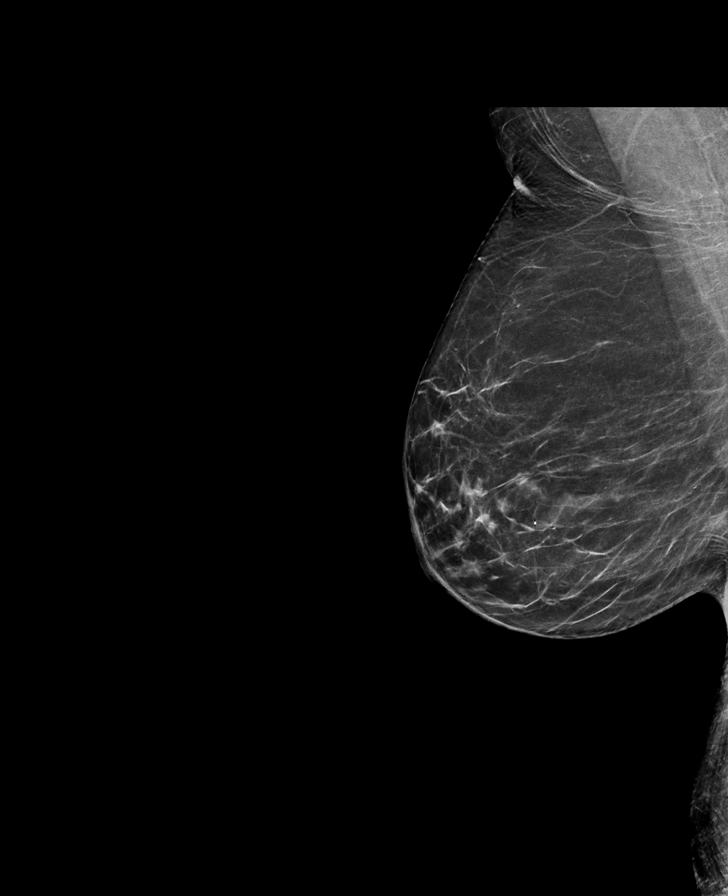

[R CC synth-2D]
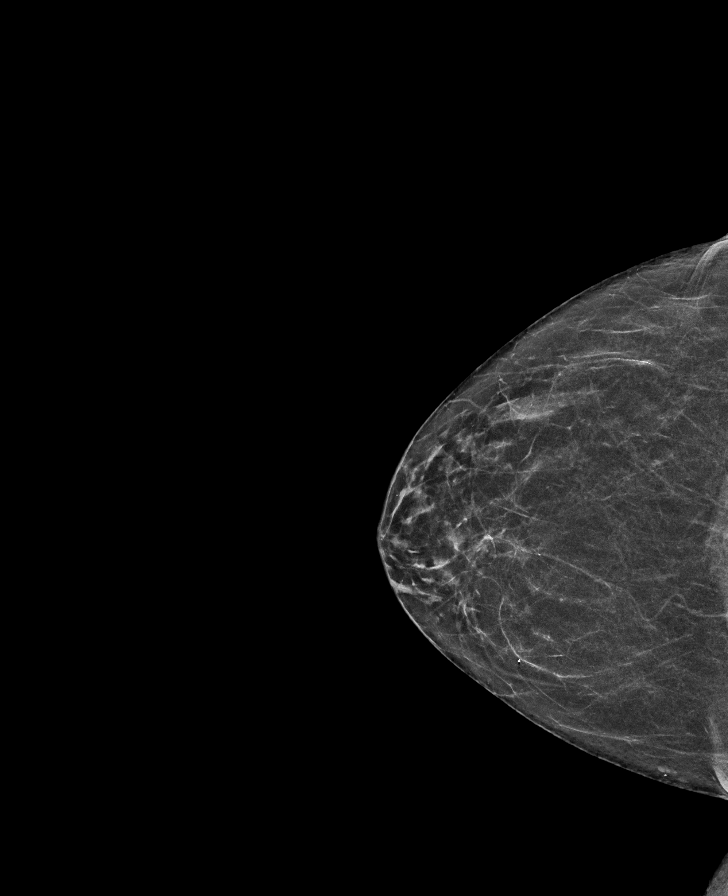

[L CC synth-2D]
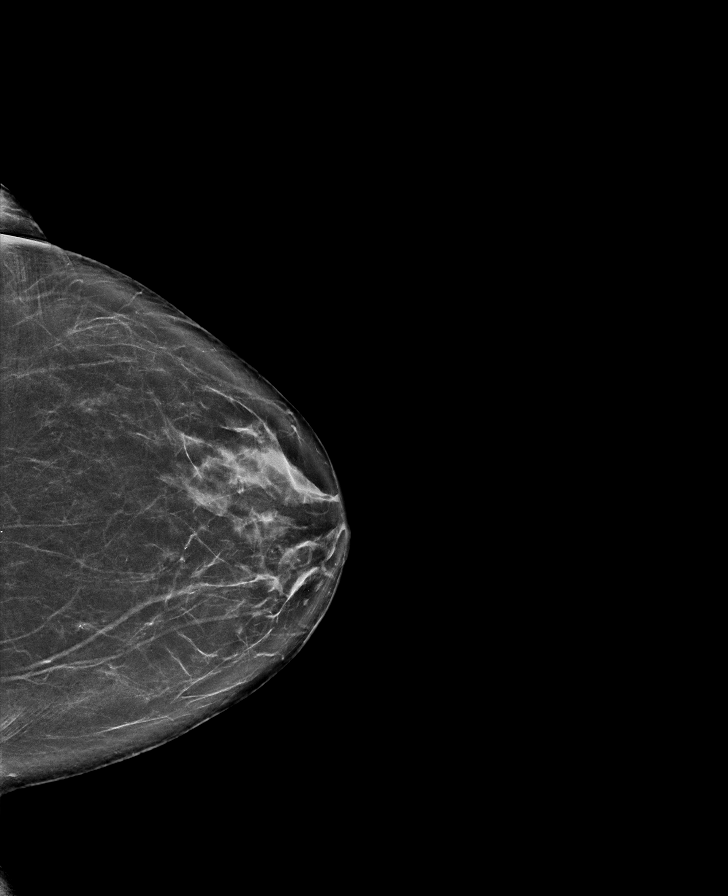

[R MLO tomo · tomo slice 31/62.0]
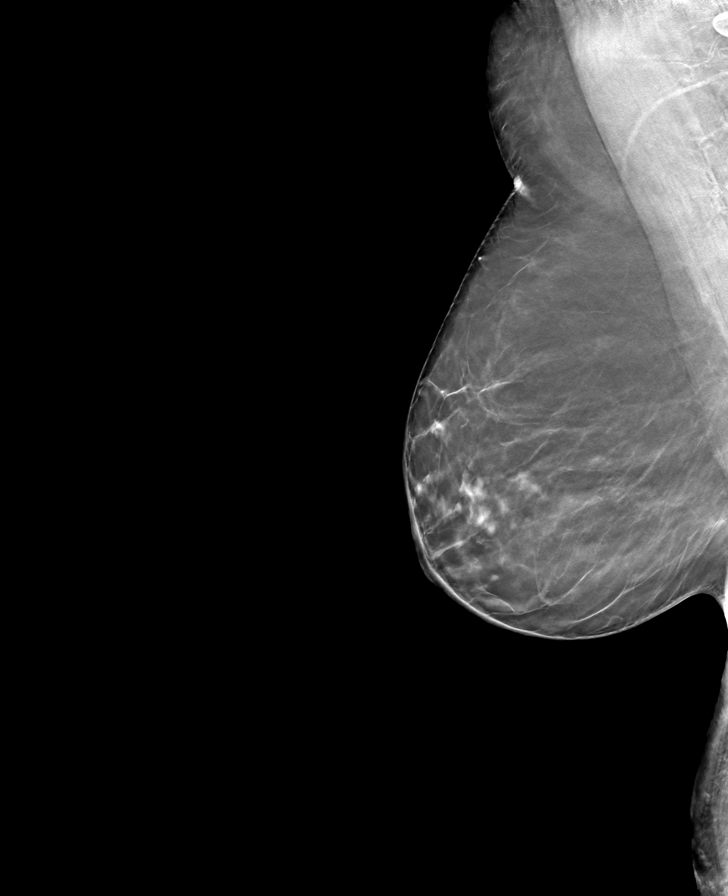

[L CC tomo · tomo slice 37/73.0]
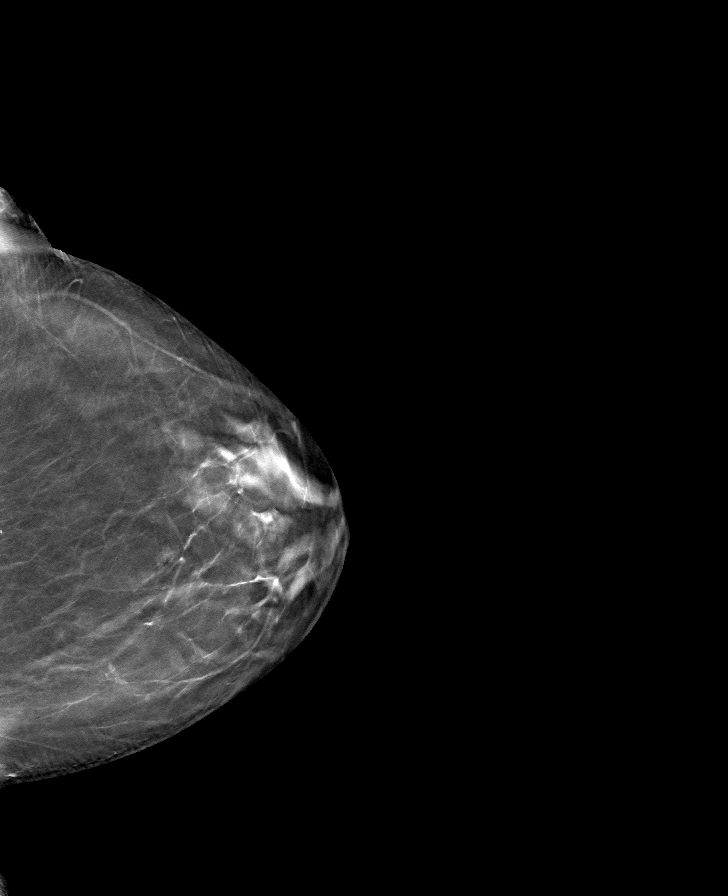

[R CC tomo · tomo slice 27/52.0]
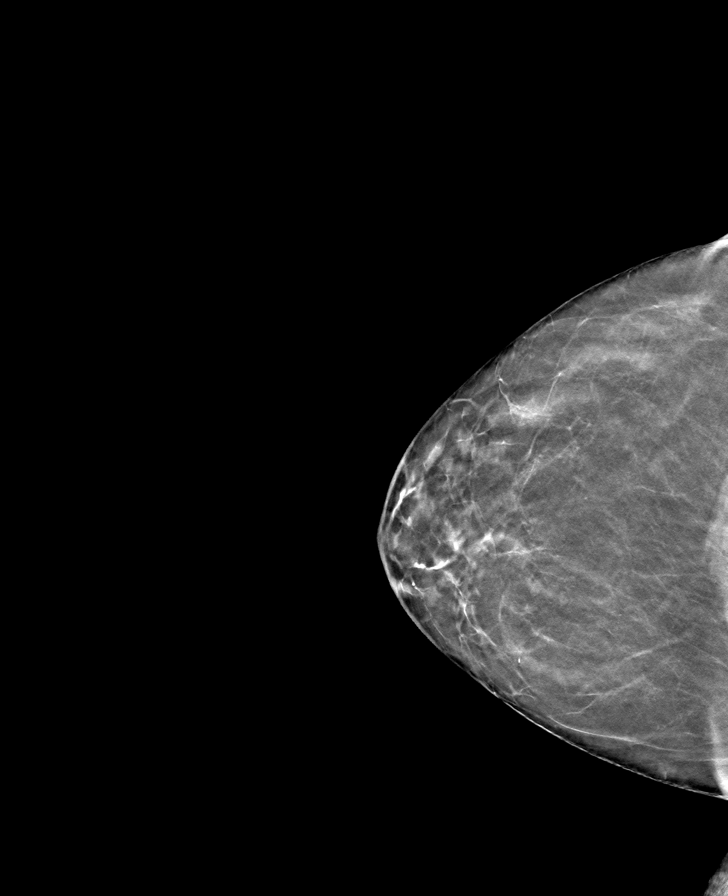

[L MLO tomo · tomo slice 35/68.0]
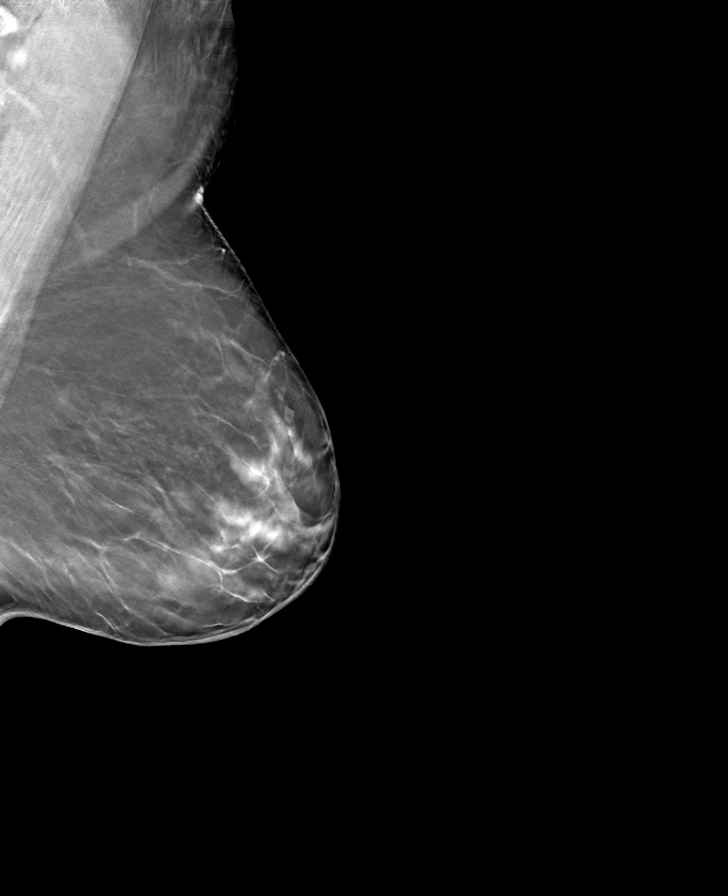

[8 of 24 positions shown; findings below may reference images not displayed]

ACR Breast Density Category b: There are scattered areas of
fibroglandular density.
FINDINGS: There are no findings suspicious for malignancy. Images were
processed with CAD.
IMPRESSION: No mammographic evidence of malignancy. A result letter of this
screening mammogram will be mailed directly to the patient.

RECOMMENDATION:
Screening mammogram in one year. (Code:[id number])

BI-RADS CATEGORY  1: Negative.

## 2020-07-02 ENCOUNTER — Encounter: Payer: Self-pay | Admitting: Obstetrics & Gynecology

## 2020-07-02 ENCOUNTER — Ambulatory Visit (INDEPENDENT_AMBULATORY_CARE_PROVIDER_SITE_OTHER): Payer: Medicare Other | Admitting: Obstetrics & Gynecology

## 2020-07-02 ENCOUNTER — Other Ambulatory Visit: Payer: Self-pay

## 2020-07-02 VITALS — BP 122/82 | Ht 59.0 in | Wt 125.0 lb

## 2020-07-02 DIAGNOSIS — Z1231 Encounter for screening mammogram for malignant neoplasm of breast: Secondary | ICD-10-CM | POA: Diagnosis not present

## 2020-07-02 DIAGNOSIS — Z01419 Encounter for gynecological examination (general) (routine) without abnormal findings: Secondary | ICD-10-CM | POA: Diagnosis not present

## 2020-07-02 NOTE — Patient Instructions (Addendum)
PAP every three years Mammogram every year    Call [phone] to schedule at [facility] (after 08/01/20) Colonoscopy every 10 years Labs yearly (with PCP)  Thank you for choosing [facility]. As part of our ongoing efforts to improve patient experience, we would appreciate your feedback. Please fill out the short survey that you will receive by mail or MyChart. Your opinion is important to [place]! - Dr. Kenton Kingfisher

## 2020-07-02 NOTE — Progress Notes (Signed)
HPI:      Ms. Zoeya Gramajo is a 74 y.o. G2P0 who LMP was in the past, she presents today for her annual examination.  The patient has no complaints today. The patient is not currently sexually active. Herlast pap: approximate date 2019 and was normal and last mammogram: approximate date 2020 and was normal.  Prior h/o VAIN.  Prior hysterectomy. The patient does perform self breast exams.  There is no notable family history of breast or ovarian cancer in her family. The patient is not taking hormone replacement therapy. Patient denies post-menopausal vaginal bleeding.   The patient has regular exercise: yes. The patient denies current symptoms of depression.    GYN Hx: Last Colonoscopy:4 years ago. Normal.  Last DEXA: year ago.    PMHx: Past Medical History:  Diagnosis Date  . Arthritis    osteo - hands  . Basal cell carcinoma of nose   . Diabetes mellitus without complication ([place])   . Headache    migraines - every 3-4 months  . Hypercholesteremia   . Hypertension   . Hypothyroidism   . Motion sickness    roller coasters, back seat of car  . Osteoporosis   . Seasonal allergies   . Skin cancer of face    Past Surgical History:  Procedure Laterality Date  . ABDOMINAL HYSTERECTOMY    . ANKLE GANGLION CYST EXCISION    . CATARACT EXTRACTION W/PHACO Left 08/20/2015   Procedure: CATARACT EXTRACTION PHACO AND INTRAOCULAR LENS PLACEMENT (IOC);  Surgeon: Leandrew Koyanagi, MD;  Location: [place];  Service: Ophthalmology;  Laterality: Left;  DIABETIC - oral meds  . CATARACT EXTRACTION W/PHACO Right 11/26/2015   Procedure: CATARACT EXTRACTION PHACO AND INTRAOCULAR LENS PLACEMENT (IOC);  Surgeon: Leandrew Koyanagi, MD;  Location: [facility];  Service: Ophthalmology;  Laterality: Right;  DIABETIC  . CHOLECYSTECTOMY    . COLONOSCOPY WITH PROPOFOL N/A 02/05/2016   Procedure: COLONOSCOPY WITH PROPOFOL;  Surgeon: Manya Silvas, MD;  Location[facility] ENDOSCOPY;  Service:  Endoscopy;  Laterality: N/A;  . EYE SURGERY Right 2016   retina  . OOPHORECTOMY    . THYROID SURGERY     Family History  Problem Relation Age of Onset  . Heart disease Mother   . Hypertension Father   . Uterine cancer Sister    Social History   Tobacco Use  . Smoking status: Never Smoker  . Smokeless tobacco: Never Used  Vaping Use  . Vaping Use: Never used  Substance Use Topics  . Alcohol use: No  . Drug use: No    Current Outpatient Medications:  .  acetaminophen (TYLENOL) 325 MG tablet, Take 650 mg by mouth every 6 (six) hours as needed., Disp: , Rfl:  .  aspirin-acetaminophen-caffeine (EXCEDRIN MIGRAINE) 250-250-65 MG tablet, Take by mouth every 6 (six) hours as needed for headache., Disp: , Rfl:  .  Calcium-Vitamin D-Vitamin K (CALCIUM SOFT CHEWS PO), Take by mouth. AM, Disp: , Rfl:  .  cetirizine (ZYRTEC) 10 MG tablet, Take 10 mg by mouth daily as needed for allergies., Disp: , Rfl:  .  denosumab (PROLIA) 60 MG/ML SOLN injection, Inject 60 mg into the skin every 6 (six) months. Administer in upper arm, thigh, or abdomen, Disp: , Rfl:  .  HYDROcodone-acetaminophen (NORCO/VICODIN) 5-325 MG tablet, Take 1 tablet by mouth every 4 (four) hours as needed for moderate pain., Disp: 20 tablet, Rfl: 0 .  latanoprost (XALATAN) 0.005 % ophthalmic solution, Place 1 drop into both  eyes at bedtime., Disp: , Rfl:  .  levothyroxine (SYNTHROID, LEVOTHROID) 75 MCG tablet, Take 75 mcg by mouth daily before breakfast., Disp: , Rfl:  .  metFORMIN (GLUCOPHAGE) 500 MG tablet, Take 500 mg by mouth daily. AM, Disp: , Rfl:  .  simvastatin (ZOCOR) 20 MG tablet, Take 20 mg by mouth daily. AM, Disp: , Rfl:  .  timolol (TIMOPTIC) 0.5 % ophthalmic solution, USE 1 DROP(S) IN BOTH EYES ONCE A DAY, Disp: , Rfl: 5 .  triamterene-hydrochlorothiazide (DYAZIDE) 37.5-25 MG capsule, Take 1 capsule by mouth daily. AM, Disp: , Rfl:  .  VITAMIN E PO, Take by mouth. AM, Disp: , Rfl:  Allergies: Augmentin  [amoxicillin-pot clavulanate], Biaxin [clarithromycin], Codeine, Ibuprofen, and Morphine and related  Review of Systems  Constitutional: Negative for chills, fever and malaise/fatigue.  HENT: Negative for congestion, sinus pain and sore throat.   Eyes: Negative for blurred vision and pain.  Respiratory: Negative for cough and wheezing.   Cardiovascular: Negative for chest pain and leg swelling.  Gastrointestinal: Negative for abdominal pain, constipation, diarrhea, heartburn, nausea and vomiting.  Genitourinary: Negative for dysuria, frequency, hematuria and urgency.  Musculoskeletal: Negative for back pain, joint pain, myalgias and neck pain.  Skin: Negative for itching and rash.  Neurological: Negative for dizziness, tremors and weakness.  Endo/Heme/Allergies: Does not bruise/bleed easily.  Psychiatric/Behavioral: Negative for depression. The patient is not nervous/anxious and does not have insomnia.     Objective: BP 122/82   Ht 4\' 11"  (1.499 m)   Wt 125 lb (56.7 kg)   BMI 25.25 kg/m   Filed Weights   07/02/20 1320  Weight: 125 lb (56.7 kg)   Body mass index is 25.25 kg/m. Physical Exam Constitutional:      General: She is not in acute distress.    Appearance: She is well-developed.  Genitourinary:     Pelvic exam was performed with patient supine.     Vulva, urethra, bladder, vagina and rectum normal.     No lesions in the vagina.     No vaginal bleeding.     Cervix is absent.     Uterus is absent.     No right or left adnexal mass present.     Right adnexa not tender.     Left adnexa not tender.     Genitourinary Comments: Vaginal cuff well healed  HENT:     Head: Normocephalic and atraumatic. No laceration.     Right Ear: Hearing normal.     Left Ear: Hearing normal.     Mouth/Throat:     Pharynx: Uvula midline.  Eyes:     Pupils: Pupils are equal, round, and reactive to light.  Neck:     Thyroid: No thyromegaly.  Cardiovascular:     Rate and Rhythm: Normal  rate and regular rhythm.     Heart sounds: No murmur heard.  No friction rub. No gallop.   Pulmonary:     Effort: Pulmonary effort is normal. No respiratory distress.     Breath sounds: Normal breath sounds. No wheezing.  Chest:     Breasts:        Right: No mass, skin change or tenderness.        Left: No mass, skin change or tenderness.  Abdominal:     General: Bowel sounds are normal. There is no distension.     Palpations: Abdomen is soft.     Tenderness: There is no abdominal tenderness. There is no rebound.  Musculoskeletal:  General: Normal range of motion.     Cervical back: Normal range of motion and neck supple.  Neurological:     Mental Status: She is alert and oriented to person, place, and time.     Cranial Nerves: No cranial nerve deficit.  Skin:    General: Skin is warm and dry.  Psychiatric:        Judgment: Judgment normal.  Vitals reviewed.     Assessment: Annual Exam 1. Women's annual routine gynecological examination   2. Encounter for mammogram to establish baseline mammogram     Plan:            1.  Vaginal Screening/ Prior VAIN-  Pap smear schedule reviewed with patient, plan PAP next year  2. Breast screening- Exam annually and mammogram scheduled  3. Colonoscopy every 5 years, Hemoccult testing after age 51; colonoscopy sch for 01/2021  4. Labs managed by PCP  5. Counseling for hormonal therapy: none              6. FRAX - FRAX score for assessing the 10 year probability for fracture calculated and discussed today.  Based on age and score today, DEXA is not currently scheduled.  Rheum sees her and continues w Prolia.    F/U  Return in about 1 year (around 07/02/2021) for Annual.  Barnett Applebaum, MD, [facility], [facility] 07/02/2020  1:44 PM

## 2020-08-04 ENCOUNTER — Other Ambulatory Visit: Payer: Self-pay

## 2020-08-04 ENCOUNTER — Ambulatory Visit
Admission: RE | Admit: 2020-08-04 | Discharge: 2020-08-04 | Disposition: A | Discharge status: Home / Self Care | Payer: Medicare Other | Source: Ambulatory Visit | Attending: Obstetrics & Gynecology | Admitting: Obstetrics & Gynecology

## 2020-08-04 DIAGNOSIS — Z1231 Encounter for screening mammogram for malignant neoplasm of breast: Secondary | ICD-10-CM | POA: Insufficient documentation

## 2020-08-05 ENCOUNTER — Encounter: Payer: Self-pay | Admitting: Obstetrics & Gynecology

## 2021-07-13 ENCOUNTER — Ambulatory Visit (INDEPENDENT_AMBULATORY_CARE_PROVIDER_SITE_OTHER): Payer: Medicare Other | Admitting: Obstetrics & Gynecology

## 2021-07-13 ENCOUNTER — Other Ambulatory Visit: Payer: Self-pay

## 2021-07-13 ENCOUNTER — Other Ambulatory Visit (HOSPITAL_COMMUNITY)
Admission: RE | Admit: 2021-07-13 | Discharge: 2021-07-13 | Disposition: A | Discharge status: Home / Self Care | Payer: Medicare Other | Source: Ambulatory Visit | Attending: Obstetrics & Gynecology | Admitting: Obstetrics & Gynecology

## 2021-07-13 ENCOUNTER — Encounter: Payer: Self-pay | Admitting: Obstetrics & Gynecology

## 2021-07-13 VITALS — BP 130/80 | Ht 59.0 in | Wt 119.0 lb

## 2021-07-13 DIAGNOSIS — N893 Dysplasia of vagina, unspecified: Secondary | ICD-10-CM | POA: Diagnosis present

## 2021-07-13 DIAGNOSIS — Z124 Encounter for screening for malignant neoplasm of cervix: Secondary | ICD-10-CM | POA: Diagnosis not present

## 2021-07-13 DIAGNOSIS — Z1231 Encounter for screening mammogram for malignant neoplasm of breast: Secondary | ICD-10-CM

## 2021-07-13 DIAGNOSIS — Z01411 Encounter for gynecological examination (general) (routine) with abnormal findings: Secondary | ICD-10-CM | POA: Diagnosis not present

## 2021-07-13 DIAGNOSIS — Z01419 Encounter for gynecological examination (general) (routine) without abnormal findings: Secondary | ICD-10-CM

## 2021-07-13 NOTE — Progress Notes (Signed)
HPI:      [name] is a 75 y.o. G2P0 who LMP was in the past, she presents today for her annual examination; prior hysterectomy; and h/o VAIN.  The patient has no complaints today. The patient is not currently sexually active. Herlast pap: approximate date 2019 and was normal and last mammogram: approximate date 2021 and was normal.  The patient does perform self breast exams.  There is no notable family history of breast or ovarian cancer in her family. The patient is not taking hormone replacement therapy. Patient denies post-menopausal vaginal bleeding.   The patient has regular exercise: yes. The patient denies current symptoms of depression.    GYN Hx: Last Colonoscopy:5 years ago. Normal. Scheduled again this Dec. Last DEXA: 2 years ago. Takes Prolia, due again in 2023.   PMHx: Past Medical History:  Diagnosis Date   Arthritis    osteo - hands   Basal cell carcinoma of nose    Diabetes mellitus without complication (HCC)    Headache    migraines - every 3-4 months   Hypercholesteremia    Hypertension    Hypothyroidism    Motion sickness    roller coasters, back seat of car   Osteoporosis    Seasonal allergies    Skin cancer of face    Past Surgical History:  Procedure Laterality Date   ABDOMINAL HYSTERECTOMY     ANKLE GANGLION CYST EXCISION     CATARACT EXTRACTION W/PHACO Left 08/20/2015   Procedure: CATARACT EXTRACTION PHACO AND INTRAOCULAR LENS PLACEMENT ([employer]);  Surgeon: Leandrew Koyanagi, MD;  Location: [place];  Service: Ophthalmology;  Laterality: Left;  DIABETIC - oral meds   CATARACT EXTRACTION W/PHACO Right 11/26/2015   Procedure: CATARACT EXTRACTION PHACO AND INTRAOCULAR LENS PLACEMENT (IOC);  Surgeon: Leandrew Koyanagi, MD;  Location: [place];  Service: Ophthalmology;  Laterality: Right;  DIABETIC   CHOLECYSTECTOMY     COLONOSCOPY WITH PROPOFOL N/A 02/05/2016   Procedure: COLONOSCOPY WITH PROPOFOL;  Surgeon: Manya Silvas, MD;   Location: [facility] ENDOSCOPY;  Service: Endoscopy;  Laterality: N/A;   EYE SURGERY Right 2016   retina   OOPHORECTOMY     THYROID SURGERY     Family History  Problem Relation Age of Onset   Heart disease Mother    Hypertension Father    Uterine cancer Sister    Social History   Tobacco Use   Smoking status: Never   Smokeless tobacco: Never  Vaping Use   Vaping Use: Never used  Substance Use Topics   Alcohol use: No   Drug use: No    Current Outpatient Medications:    acetaminophen (TYLENOL) 325 MG tablet, Take 650 mg by mouth every 6 (six) hours as needed., Disp: , Rfl:    aspirin-acetaminophen-caffeine (EXCEDRIN MIGRAINE) 250-250-65 MG tablet, Take by mouth every 6 (six) hours as needed for headache., Disp: , Rfl:    Calcium-Vitamin D-Vitamin K (CALCIUM SOFT CHEWS PO), Take by mouth. AM, Disp: , Rfl:    cetirizine (ZYRTEC) 10 MG tablet, Take 10 mg by mouth daily as needed for allergies., Disp: , Rfl:    denosumab (PROLIA) 60 MG/ML SOLN injection, Inject 60 mg into the skin every 6 (six) months. Administer in upper arm, thigh, or abdomen, Disp: , Rfl:    HYDROcodone-acetaminophen (NORCO/VICODIN) 5-325 MG tablet, Take 1 tablet by mouth every 4 (four) hours as needed for moderate pain., Disp: 20 tablet, Rfl: 0   latanoprost (XALATAN) 0.005 % ophthalmic  solution, Place 1 drop into both eyes at bedtime., Disp: , Rfl:    levothyroxine (SYNTHROID, LEVOTHROID) 75 MCG tablet, Take 75 mcg by mouth daily before breakfast., Disp: , Rfl:    metFORMIN (GLUCOPHAGE) 500 MG tablet, Take 500 mg by mouth daily. AM, Disp: , Rfl:    predniSONE (DELTASONE) 5 MG tablet, Take by mouth., Disp: , Rfl:    simvastatin (ZOCOR) 20 MG tablet, Take 20 mg by mouth daily. AM, Disp: , Rfl:    timolol (TIMOPTIC) 0.5 % ophthalmic solution, USE 1 DROP(S) IN BOTH EYES ONCE A DAY, Disp: , Rfl: 5   triamterene-hydrochlorothiazide (DYAZIDE) 37.5-25 MG capsule, Take 1 capsule by mouth daily. AM, Disp: , Rfl:    VITAMIN E  PO, Take by mouth. AM, Disp: , Rfl:  Allergies: Augmentin [amoxicillin-pot clavulanate], Biaxin [clarithromycin], Codeine, Ibuprofen, and Morphine and related  Review of Systems  Constitutional:  Negative for chills, fever and malaise/fatigue.  HENT:  Negative for congestion, sinus pain and sore throat.   Eyes:  Negative for blurred vision and pain.  Respiratory:  Negative for cough and wheezing.   Cardiovascular:  Negative for chest pain and leg swelling.  Gastrointestinal:  Negative for abdominal pain, constipation, diarrhea, heartburn, nausea and vomiting.  Genitourinary:  Negative for dysuria, frequency, hematuria and urgency.  Musculoskeletal:  Negative for back pain, joint pain, myalgias and neck pain.  Skin:  Negative for itching and rash.  Neurological:  Negative for dizziness, tremors and weakness.  Endo/Heme/Allergies:  Does not bruise/bleed easily.  Psychiatric/Behavioral:  Negative for depression. The patient is not nervous/anxious and does not have insomnia.    Objective: BP 130/80   Ht '4\' 11"'$  (1.499 m)   Wt 119 lb (54 kg)   BMI 24.04 kg/m   Filed Weights   07/13/21 1332  Weight: 119 lb (54 kg)   Body mass index is 24.04 kg/m. Physical Exam Constitutional:      General: She is not in acute distress.    Appearance: She is well-developed.  Genitourinary:     Vulva, bladder, rectum and urethral meatus normal.     No lesions in the vagina.     Genitourinary Comments: Vaginal cuff well healed     Right Labia: No rash, tenderness or lesions.    Left Labia: No tenderness, lesions or rash.    No vaginal bleeding.     No vaginal prolapse present.    Moderate vaginal atrophy present.     Right Adnexa: not tender and no mass present.    Left Adnexa: not tender and no mass present.    Cervix is absent.     Uterus is absent.     Pelvic exam was performed with patient in the lithotomy position.  Breasts:    Right: No mass, skin change or tenderness.     Left: No mass,  skin change or tenderness.  HENT:     Head: Normocephalic and atraumatic. No laceration.     Right Ear: Hearing normal.     Left Ear: Hearing normal.     Mouth/Throat:     Pharynx: Uvula midline.  Eyes:     Pupils: Pupils are equal, round, and reactive to light.  Neck:     Thyroid: No thyromegaly.  Cardiovascular:     Rate and Rhythm: Normal rate and regular rhythm.     Heart sounds: No murmur heard.   No friction rub. No gallop.  Pulmonary:     Effort: Pulmonary effort is normal. No  respiratory distress.     Breath sounds: Normal breath sounds. No wheezing.  Abdominal:     General: Bowel sounds are normal. There is no distension.     Palpations: Abdomen is soft.     Tenderness: There is no abdominal tenderness. There is no rebound.  Musculoskeletal:        General: Normal range of motion.     Cervical back: Normal range of motion and neck supple.  Neurological:     Mental Status: She is alert and oriented to person, place, and time.     Cranial Nerves: No cranial nerve deficit.  Skin:    General: Skin is warm and dry.  Psychiatric:        Judgment: Judgment normal.  Vitals reviewed.    Assessment: Annual Exam 1. Women's annual routine gynecological examination   2. VAIN (vaginal intraepithelial neoplasia)   3. Encounter for mammogram to establish baseline mammogram     Plan:            1.  Vaginal Screening-  Pap smear done today  2. Breast screening- Exam annually and mammogram scheduled  3. Colonoscopy every 5 years, Hemoccult testing after age 16  4. Labs managed by PCP  5. Counseling for hormonal therapy: none              6. FRAX - FRAX score for assessing the 10 year probability for fracture calculated and discussed today.  Based on age and score today, DEXA is scheduled for spring 2023.  Takes Prolia, followed by [facility].    F/U  Return in about 1 year (around 07/13/2022) for Annual.  Barnett Applebaum, MD, [facility], [facility] 07/13/2021  2:00 PM

## 2021-07-13 NOTE — Patient Instructions (Addendum)
PAP every three years Mammogram every year    Call [phone] to schedule at [facility] Colonoscopy every 10 years Labs yearly (with PCP)  Thank you for choosing [facility]. As part of our ongoing efforts to improve patient experience, we would appreciate your feedback. Please fill out the short survey that you will receive by mail or MyChart. Your opinion is important to [place]! - Dr. Kenton Kingfisher

## 2021-07-16 LAB — CYTOLOGY - PAP: Diagnosis: NEGATIVE

## 2021-08-05 ENCOUNTER — Ambulatory Visit
Admission: RE | Admit: 2021-08-05 | Discharge: 2021-08-05 | Disposition: A | Discharge status: Home / Self Care | Payer: Medicare Other | Source: Ambulatory Visit | Attending: Obstetrics & Gynecology | Admitting: Obstetrics & Gynecology

## 2021-08-05 ENCOUNTER — Other Ambulatory Visit: Payer: Self-pay

## 2021-08-05 DIAGNOSIS — Z1231 Encounter for screening mammogram for malignant neoplasm of breast: Secondary | ICD-10-CM | POA: Insufficient documentation

## 2021-08-09 ENCOUNTER — Encounter: Payer: Self-pay | Admitting: Obstetrics & Gynecology

## 2021-10-06 ENCOUNTER — Encounter: Payer: Self-pay | Admitting: Internal Medicine

## 2021-10-07 ENCOUNTER — Ambulatory Visit: Payer: Medicare Other | Admitting: Anesthesiology

## 2021-10-07 ENCOUNTER — Ambulatory Visit
Admission: RE | Admit: 2021-10-07 | Discharge: 2021-10-07 | Disposition: A | Discharge status: Home / Self Care | Payer: Medicare Other | Source: Ambulatory Visit | Attending: Internal Medicine | Admitting: Internal Medicine

## 2021-10-07 ENCOUNTER — Encounter
Admission: RE | Disposition: A | Discharge status: Home / Self Care | Payer: Self-pay | Source: Ambulatory Visit | Attending: Internal Medicine

## 2021-10-07 ENCOUNTER — Encounter: Payer: Self-pay | Admitting: Internal Medicine

## 2021-10-07 DIAGNOSIS — E119 Type 2 diabetes mellitus without complications: Secondary | ICD-10-CM | POA: Insufficient documentation

## 2021-10-07 DIAGNOSIS — Z7984 Long term (current) use of oral hypoglycemic drugs: Secondary | ICD-10-CM | POA: Insufficient documentation

## 2021-10-07 DIAGNOSIS — Z86010 Personal history of colonic polyps: Secondary | ICD-10-CM | POA: Diagnosis not present

## 2021-10-07 DIAGNOSIS — Z1211 Encounter for screening for malignant neoplasm of colon: Secondary | ICD-10-CM | POA: Insufficient documentation

## 2021-10-07 HISTORY — DX: Varicella without complication: B01.9

## 2021-10-07 HISTORY — DX: Gout, unspecified: M10.9

## 2021-10-07 HISTORY — DX: Unspecified hemorrhoids: K64.9

## 2021-10-07 HISTORY — PX: COLONOSCOPY: SHX5424

## 2021-10-07 HISTORY — DX: Hyperlipidemia, unspecified: E78.5

## 2021-10-07 HISTORY — DX: Vitamin D deficiency, unspecified: E55.9

## 2021-10-07 LAB — GLUCOSE, CAPILLARY: Glucose-Capillary: 128 mg/dL — ABNORMAL HIGH (ref 70–99)

## 2021-10-07 SURGERY — COLONOSCOPY
Anesthesia: General

## 2021-10-07 MED ORDER — PROPOFOL 500 MG/50ML IV EMUL
INTRAVENOUS | Status: DC | PRN
  Administered 2021-10-07: 125 ug/kg/min via INTRAVENOUS

## 2021-10-07 MED ORDER — PHENYLEPHRINE HCL (PRESSORS) 10 MG/ML IV SOLN
INTRAVENOUS | Status: AC
  Filled 2021-10-07: qty 1

## 2021-10-07 MED ORDER — SODIUM CHLORIDE 0.9 % IV SOLN: INTRAVENOUS | Status: DC

## 2021-10-07 MED ORDER — PROPOFOL 10 MG/ML IV BOLUS
INTRAVENOUS | Status: DC | PRN
  Administered 2021-10-07: 40 mg via INTRAVENOUS

## 2021-10-07 MED ORDER — PROPOFOL 10 MG/ML IV BOLUS
INTRAVENOUS | Status: AC
  Filled 2021-10-07: qty 40

## 2021-10-07 NOTE — Interval H&P Note (Signed)
History and Physical Interval Note:  10/07/2021 8:55 AM  Chelsea Hurley  has presented today for surgery, with the diagnosis of PERSONAL HX.OFNCOLON POLYPS.  The various methods of treatment have been discussed with the patient and family. After consideration of risks, benefits and other options for treatment, the patient has consented to  Procedure(s) with comments: COLONOSCOPY (N/A) - DM as a surgical intervention.  The patient's history has been reviewed, patient examined, no change in status, stable for surgery.  I have reviewed the patient's chart and labs.  Questions were answered to the patient's satisfaction.     [place], [place]

## 2021-10-07 NOTE — Op Note (Signed)
[facility] Patient Name: Chelsea Hurley Procedure Date: 10/07/2021 7:46 AM MRN: [id number] Account #: [id number] Date of Birth: [birth year] Admit Type: Outpatient Age: 75 Room: [facility] ENDO ROOM 2 Gender: Female Note Status: Finalized Instrument Name: Park Meo [id number] Procedure:             Colonoscopy Indications:           Surveillance: Personal history of adenomatous polyps                         on last colonoscopy > 5 years ago Providers:             Lorie Apley K. Alice Reichert MD, MD Referring MD:          Irven Easterly. Kary Kos, MD (Referring MD) Medicines:             Propofol per Anesthesia Complications:         No immediate complications. Procedure:             Pre-Anesthesia Assessment:                        - The risks and benefits of the procedure and the                         sedation options and risks were discussed with the                         patient. All questions were answered and informed                         consent was obtained.                        - Patient identification and proposed procedure were                         verified prior to the procedure by the nurse. The                         procedure was verified in the procedure room.                        - ASA Grade Assessment: III - A patient with severe                         systemic disease.                        - After reviewing the risks and benefits, the patient                         was deemed in satisfactory condition to undergo the                         procedure.                        After obtaining informed consent, the colonoscope was  passed under direct vision. Throughout the procedure,                         the patient's blood pressure, pulse, and oxygen                         saturations were monitored continuously. The                         Colonoscope was introduced through the anus and                          advanced to the the cecum, identified by appendiceal                         orifice and ileocecal valve. The colonoscopy was                         performed without difficulty. The patient tolerated                         the procedure well. The quality of the bowel                         preparation was good. The ileocecal valve, appendiceal                         orifice, and rectum were photographed. Findings:      The perianal and digital rectal examinations were normal. Pertinent       negatives include normal sphincter tone and no palpable rectal lesions.      The entire examined colon appeared normal on direct and retroflexion       views. Impression:            - The entire examined colon is normal on direct and                         retroflexion views.                        - No specimens collected. Recommendation:        - Patient has a contact number available for                         emergencies. The signs and symptoms of potential                         delayed complications were discussed with the patient.                         Return to normal activities tomorrow. Written                         discharge instructions were provided to the patient.                        - Resume previous diet.                        -  Continue present medications.                        - No repeat colonoscopy due to current age (54 years                         or older) and the absence of colonic polyps.                        - You do NOT require further colon cancer screening                         measures (Annual stool testing (i.e. hemoccult, FIT,                         cologuard), sigmoidoscopy, colonoscopy or CT                         colonography). You should share this recommendation                         with your Primary Care provider.                        - Return to GI clinic PRN.                        - The findings and recommendations were discussed  with                         the patient. Procedure Code(s):     --- Professional ---                        M1962, Colorectal cancer screening; colonoscopy on                         individual at high risk Diagnosis Code(s):     --- Professional ---                        Z86.010, Personal history of colonic polyps CPT copyright 2019 [employer]. All rights reserved. The codes documented in this report are preliminary and upon coder review may  be revised to meet current compliance requirements. Efrain Sella MD, MD 10/07/2021 9:14:21 AM This report has been signed electronically. Number of Addenda: 0 Note Initiated On: 10/07/2021 7:46 AM Scope Withdrawal Time: 0 hours 5 minutes 35 seconds  Total Procedure Duration: 0 hours 10 minutes 32 seconds  Estimated Blood Loss:  Estimated blood loss: none.      [facility]

## 2021-10-07 NOTE — H&P (Signed)
Outpatient short stay form Pre-procedure 10/07/2021 8:54 AM Antwoin Lackey K. Alice Reichert, M.D.  Primary Physician: [place] Pink, M.D.  Reason for visit:  Personal history of colon polyps  History of present illness:                            Patient presents for colonoscopy for a personal hx of colon polyps. The patient denies abdominal pain, abnormal weight loss or rectal bleeding.      Current Facility-Administered Medications:    0.9 %  sodium chloride infusion, , Intravenous, Continuous, [place], Benay Pike, MD, Last Rate: 20 mL/hr at 10/07/21 6270, Continued from Pre-op at 10/07/21 0853  Medications Prior to Admission  Medication Sig Dispense Refill Last Dose   colchicine 0.6 MG tablet Take 0.6 mg by mouth daily.      latanoprost (XALATAN) 0.005 % ophthalmic solution Place 1 drop into both eyes at bedtime.   10/06/2021   levothyroxine (SYNTHROID, LEVOTHROID) 75 MCG tablet Take 75 mcg by mouth daily before breakfast.   10/06/2021   metFORMIN (GLUCOPHAGE) 500 MG tablet Take 500 mg by mouth daily. AM   10/06/2021   simvastatin (ZOCOR) 20 MG tablet Take 20 mg by mouth daily. AM   10/06/2021   timolol (TIMOPTIC) 0.5 % ophthalmic solution USE 1 DROP(S) IN BOTH EYES ONCE A DAY  5 10/06/2021   triamterene-hydrochlorothiazide (DYAZIDE) 37.5-25 MG capsule Take 1 capsule by mouth daily. AM   10/06/2021   acetaminophen (TYLENOL) 325 MG tablet Take 650 mg by mouth every 6 (six) hours as needed.      aspirin-acetaminophen-caffeine (EXCEDRIN MIGRAINE) 250-250-65 MG tablet Take by mouth every 6 (six) hours as needed for headache.      Calcium-Vitamin D-Vitamin K (CALCIUM SOFT CHEWS PO) Take by mouth. AM      cetirizine (ZYRTEC) 10 MG tablet Take 10 mg by mouth daily as needed for allergies.      denosumab (PROLIA) 60 MG/ML SOLN injection Inject 60 mg into the skin every 6 (six) months. Administer in upper arm, thigh, or abdomen      HYDROcodone-acetaminophen (NORCO/VICODIN) 5-325 MG tablet Take 1 tablet by mouth  every 4 (four) hours as needed for moderate pain. 20 tablet 0    predniSONE (DELTASONE) 5 MG tablet Take by mouth.      VITAMIN E PO Take by mouth. AM        Allergies  Allergen Reactions   Augmentin [Amoxicillin-Pot Clavulanate] Nausea And Vomiting   Biaxin [Clarithromycin] Other (See Comments)    metal taste in mouth   Codeine Nausea And Vomiting   Ibuprofen Nausea And Vomiting    "heavy doses cause stomach upset"   Morphine And Related Nausea And Vomiting     Past Medical History:  Diagnosis Date   Arthritis    osteo - hands   Basal cell carcinoma of nose    Chicken pox    Diabetes mellitus without complication (HCC)    Gout    Headache    migraines - every 3-4 months   Hemorrhoids    Hypercholesteremia    Hyperlipidemia    Hypertension    Hypothyroidism    Motion sickness    roller coasters, back seat of car   Osteoporosis    Seasonal allergies    Skin cancer of face    Vitamin D deficiency     Review of systems:  Otherwise negative.    Physical Exam  Gen: Alert, oriented. Appears  stated age.  HEENT: [place]/AT. PERRLA. Lungs: CTA, no wheezes. CV: RR nl S1, S2. Abd: soft, benign, no masses. BS+ Ext: No edema. Pulses 2+    Planned procedures: Proceed with colonoscopy. The patient understands the nature of the planned procedure, indications, risks, alternatives and potential complications including but not limited to bleeding, infection, perforation, damage to internal organs and possible oversedation/side effects from anesthesia. The patient agrees and gives consent to proceed.  Please refer to procedure notes for findings, recommendations and patient disposition/instructions.     Fredrica Capano K. Alice Reichert, M.D. Gastroenterology 10/07/2021  8:54 AM

## 2021-10-07 NOTE — Anesthesia Postprocedure Evaluation (Signed)
Anesthesia Post Note  Patient: Chelsea Hurley  Procedure(s) Performed: COLONOSCOPY  Patient location during evaluation: Endoscopy Anesthesia Type: General Level of consciousness: awake and alert Pain management: pain level controlled Vital Signs Assessment: post-procedure vital signs reviewed and stable Respiratory status: spontaneous breathing, nonlabored ventilation, respiratory function stable and patient connected to nasal cannula oxygen Cardiovascular status: blood pressure returned to baseline and stable Postop Assessment: no apparent nausea or vomiting Anesthetic complications: no   No notable events documented.   Last Vitals:  Vitals:   10/07/21 0822 10/07/21 0914  BP: 136/61 (!) 87/53  Pulse: 69   Resp: 18   Temp: (!) 36.1 C (!) 35.8 C  SpO2: 97%     Last Pain:  Vitals:   10/07/21 0934  TempSrc:   PainSc: 0-No pain                 Precious Haws Lorita Forinash

## 2021-10-07 NOTE — Anesthesia Preprocedure Evaluation (Addendum)
Anesthesia Evaluation  Patient identified by MRN, date of birth, ID band Patient awake    Reviewed: Allergy & Precautions, H&P , NPO status , Patient's Chart, lab work & pertinent test results, reviewed documented beta blocker date and time   History of Anesthesia Complications (+) PONV and history of anesthetic complications  Airway Mallampati: I  TM Distance: >3 FB Neck ROM: full    Dental no notable dental hx. (+) Caps, Missing   Pulmonary neg pulmonary ROS,    Pulmonary exam normal        Cardiovascular Exercise Tolerance: Good hypertension, On Medications (-) angina(-) CAD, (-) Past MI, (-) Cardiac Stents and (-) CABG Normal cardiovascular exam(-) dysrhythmias (-) Valvular Problems/Murmurs     Neuro/Psych negative neurological ROS  negative psych ROS   GI/Hepatic negative GI ROS, Neg liver ROS,   Endo/Other  diabetes, Oral Hypoglycemic AgentsHypothyroidism   Renal/GU negative Renal ROS  negative genitourinary   Musculoskeletal   Abdominal   Peds  Hematology negative hematology ROS (+)   Anesthesia Other Findings Past Medical History:   Hypertension                                                 Diabetes mellitus without complication (HCC)                 Hypercholesteremia                                           Hypothyroidism                                               Seasonal allergies                                           Basal cell carcinoma of nose                                 Osteoporosis                                                 Headache                                                       Comment:migraines - every 3-4 months   Arthritis                                                      Comment:osteo - hands   Motion sickness  Comment:roller coasters, back seat of car   Reproductive/Obstetrics negative OB ROS                             Anesthesia Physical  Anesthesia Plan  ASA: 2  Anesthesia Plan: General   Post-op Pain Management:    Induction: Intravenous  PONV Risk Score and Plan: TIVA and Propofol infusion  Airway Management Planned: Natural Airway and Nasal Cannula  Additional Equipment:   Intra-op Plan:   Post-operative Plan:   Informed Consent: I have reviewed the patients History and Physical, chart, labs and discussed the procedure including the risks, benefits and alternatives for the proposed anesthesia with the patient or authorized representative who has indicated his/her understanding and acceptance.     Dental Advisory Given  Plan Discussed with: Anesthesiologist, CRNA and Surgeon  Anesthesia Plan Comments: (Patient consented for risks of anesthesia including but not limited to:  - adverse reactions to medications - risk of airway placement if required - damage to eyes, teeth, lips or other oral mucosa - nerve damage due to positioning  - sore throat or hoarseness - Damage to heart, brain, nerves, lungs, other parts of body or loss of life  Patient voiced understanding.)        Anesthesia Quick Evaluation

## 2021-10-07 NOTE — Transfer of Care (Signed)
Immediate Anesthesia Transfer of Care Note  Patient: Chelsea Hurley  Procedure(s) Performed: COLONOSCOPY  Patient Location: Endoscopy Unit  Anesthesia Type:General  Level of Consciousness: awake, alert  and oriented  Airway & Oxygen Therapy: Patient Spontanous Breathing  Post-op Assessment: Report given to RN and Post -op Vital signs reviewed and stable  Post vital signs: Reviewed and stable  Last Vitals:  Vitals Value Taken Time  BP    Temp    Pulse    Resp    SpO2      Last Pain:  Vitals:   10/07/21 0822  TempSrc: Temporal         Complications: No notable events documented.

## 2021-10-08 ENCOUNTER — Encounter: Payer: Self-pay | Admitting: Internal Medicine

## 2022-07-26 ENCOUNTER — Other Ambulatory Visit: Payer: Self-pay | Admitting: Family Medicine

## 2022-07-26 DIAGNOSIS — Z1231 Encounter for screening mammogram for malignant neoplasm of breast: Secondary | ICD-10-CM

## 2022-08-18 ENCOUNTER — Ambulatory Visit
Admission: RE | Admit: 2022-08-18 | Discharge: 2022-08-18 | Disposition: A | Discharge status: Home / Self Care | Payer: Medicare Other | Source: Ambulatory Visit | Attending: Family Medicine | Admitting: Family Medicine

## 2022-08-18 DIAGNOSIS — Z1231 Encounter for screening mammogram for malignant neoplasm of breast: Secondary | ICD-10-CM | POA: Insufficient documentation

## 2023-03-15 ENCOUNTER — Telehealth: Payer: Self-pay

## 2023-03-15 NOTE — Telephone Encounter (Signed)
Left message for patient to call office back to schedule her annual appt 

## 2023-05-24 NOTE — Telephone Encounter (Signed)
As of 05/24/2023. Pt had not contacted office to schedule her annual exam.

## 2023-07-25 ENCOUNTER — Other Ambulatory Visit: Payer: Self-pay | Admitting: Family Medicine

## 2023-07-25 DIAGNOSIS — Z1231 Encounter for screening mammogram for malignant neoplasm of breast: Secondary | ICD-10-CM

## 2023-08-22 ENCOUNTER — Ambulatory Visit
Admission: RE | Admit: 2023-08-22 | Discharge: 2023-08-22 | Disposition: A | Discharge status: Home / Self Care | Payer: Medicare Other | Source: Ambulatory Visit | Attending: Family Medicine | Admitting: Family Medicine

## 2023-08-22 DIAGNOSIS — Z1231 Encounter for screening mammogram for malignant neoplasm of breast: Secondary | ICD-10-CM | POA: Diagnosis present

## 2024-07-20 ENCOUNTER — Other Ambulatory Visit: Payer: Self-pay | Admitting: Family Medicine

## 2024-07-20 DIAGNOSIS — Z1231 Encounter for screening mammogram for malignant neoplasm of breast: Secondary | ICD-10-CM

## 2024-08-22 ENCOUNTER — Ambulatory Visit
Admission: RE | Admit: 2024-08-22 | Discharge: 2024-08-22 | Disposition: A | Discharge status: Home / Self Care | Source: Ambulatory Visit | Attending: Family Medicine | Admitting: Family Medicine

## 2024-08-22 DIAGNOSIS — Z1231 Encounter for screening mammogram for malignant neoplasm of breast: Secondary | ICD-10-CM | POA: Insufficient documentation
